# Patient Record
Sex: Female | Born: 1945 | Race: Black or African American | Hispanic: No | Smoking: Current some day smoker
Health system: Southern US, Community
[De-identification: ages and names within clinical notes are randomized; demographics above are authoritative.]

## PROBLEM LIST (undated history)

## (undated) DIAGNOSIS — Z8489 Family history of other specified conditions: Secondary | ICD-10-CM

## (undated) DIAGNOSIS — K219 Gastro-esophageal reflux disease without esophagitis: Secondary | ICD-10-CM

## (undated) DIAGNOSIS — Z973 Presence of spectacles and contact lenses: Secondary | ICD-10-CM

## (undated) DIAGNOSIS — E041 Nontoxic single thyroid nodule: Secondary | ICD-10-CM

## (undated) DIAGNOSIS — J40 Bronchitis, not specified as acute or chronic: Secondary | ICD-10-CM

## (undated) DIAGNOSIS — C801 Malignant (primary) neoplasm, unspecified: Secondary | ICD-10-CM

## (undated) DIAGNOSIS — M8580 Other specified disorders of bone density and structure, unspecified site: Secondary | ICD-10-CM

## (undated) DIAGNOSIS — M1990 Unspecified osteoarthritis, unspecified site: Secondary | ICD-10-CM

## (undated) DIAGNOSIS — K759 Inflammatory liver disease, unspecified: Secondary | ICD-10-CM

## (undated) DIAGNOSIS — I10 Essential (primary) hypertension: Secondary | ICD-10-CM

## (undated) HISTORY — PX: APPENDECTOMY: SHX54

## (undated) HISTORY — PX: KNEE ARTHROSCOPY: SHX127

## (undated) HISTORY — PX: TUBAL LIGATION: SHX77

## (undated) HISTORY — PX: BREAST SURGERY: SHX581

## (undated) HISTORY — PX: TONSILLECTOMY: SUR1361

---

## 1978-12-09 HISTORY — PX: ABDOMINAL HYSTERECTOMY: SHX81

## 1978-12-09 HISTORY — PX: THYROIDECTOMY: SHX17

## 1996-12-09 HISTORY — PX: OSTEOTOMY ULNA: SUR997

## 1996-12-09 HISTORY — PX: OSTEOTOMY AND ULNAR SHORTENING: SHX2140

## 1998-08-24 ENCOUNTER — Ambulatory Visit (HOSPITAL_COMMUNITY)
Admission: RE | Admit: 1998-08-24 | Discharge: 1998-08-24 | Payer: Self-pay | Admitting: Physical Medicine and Rehabilitation

## 1998-08-24 ENCOUNTER — Encounter: Payer: Self-pay | Admitting: Physical Medicine and Rehabilitation

## 1999-07-09 ENCOUNTER — Encounter: Payer: Self-pay | Admitting: Specialist

## 1999-07-09 ENCOUNTER — Ambulatory Visit (HOSPITAL_COMMUNITY): Admission: RE | Admit: 1999-07-09 | Discharge: 1999-07-09 | Payer: Self-pay | Admitting: Specialist

## 1999-10-08 ENCOUNTER — Ambulatory Visit (HOSPITAL_COMMUNITY): Admission: RE | Admit: 1999-10-08 | Discharge: 1999-10-08 | Payer: Self-pay | Admitting: Specialist

## 2012-10-21 ENCOUNTER — Encounter (INDEPENDENT_AMBULATORY_CARE_PROVIDER_SITE_OTHER): Payer: Self-pay

## 2012-10-23 ENCOUNTER — Encounter (INDEPENDENT_AMBULATORY_CARE_PROVIDER_SITE_OTHER): Payer: Self-pay | Admitting: Surgery

## 2012-10-23 ENCOUNTER — Ambulatory Visit (INDEPENDENT_AMBULATORY_CARE_PROVIDER_SITE_OTHER): Payer: Medicare Other | Admitting: Surgery

## 2012-10-23 VITALS — BP 136/70 | HR 79 | Temp 97.7°F | Resp 18 | Ht 60.0 in | Wt 148.0 lb

## 2012-10-23 DIAGNOSIS — C50919 Malignant neoplasm of unspecified site of unspecified female breast: Secondary | ICD-10-CM

## 2012-10-23 DIAGNOSIS — C50911 Malignant neoplasm of unspecified site of right female breast: Secondary | ICD-10-CM

## 2012-10-23 NOTE — Progress Notes (Signed)
Patient ID: Crystal Padilla, female   DOB: [birth year], 66 y.o.   MRN: [id number]  Chief Complaint  Patient presents with  . Breast Cancer    Right    HPI Crystal Padilla is a 66 y.o. female.  She recently had a mammogram an abnormality was found high in the upper outer quadrant of the right wrist. 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 Prognostic indicators are pending. The patient was asymptomatic and unaware of a palpable mass. Her mother had breast cancer it in her 68s and is still alive, was treated with lumpectomy. That is the only known family history of breast or ovarian cancer. HPI  No past medical history on file.  Past Surgical History  Procedure Date  . Abdominal hysterectomy 1980  . Thyroidectomy 1980  . Osteotomy ulna 1998    Family History  Problem Relation Age of Onset  . Cancer Mother     breast  . Heart disease Sister   . Hypertension Maternal Grandmother     Social History History  Substance Use Topics  . Smoking status: Current Some Day Smoker -- 1.0 packs/day  . Smokeless tobacco: Not on file     Comment: 40 yrs appx  . Alcohol Use: Not on file    Allergies  Allergen Reactions  . Penicillins Hives    Current Outpatient Prescriptions  Medication Sig Dispense Refill  . alendronate (FOSAMAX) 70 MG tablet Take 70 mg by mouth every 7 (seven) days. Take with a full glass of water on an empty stomach.      Marland Kitchen amLODipine (NORVASC) 5 MG tablet Take 5 mg by mouth daily.      . calcium citrate (CALCITRATE - DOSED IN MG ELEMENTAL CALCIUM) 950 MG tablet Take 1 tablet by mouth daily.      . fish oil-omega-3 fatty acids 1000 MG capsule Take 2 g by mouth daily.        Review of Systems Review of Systems  Constitutional: Negative for fever, chills and unexpected weight change.  HENT: Negative for hearing loss, congestion, sore throat, trouble swallowing and voice change.   Eyes: Negative for visual disturbance.  Respiratory: Negative  for cough and wheezing.   Cardiovascular: Negative for chest pain, palpitations and leg swelling.  Gastrointestinal: Negative for nausea, vomiting, abdominal pain, diarrhea, constipation, blood in stool, abdominal distention and anal bleeding.  Genitourinary: Negative for hematuria, vaginal bleeding and difficulty urinating.  Musculoskeletal: Negative for arthralgias.  Skin: Negative for rash and wound.  Neurological: Negative for seizures, syncope and headaches.  Hematological: Negative for adenopathy. Does not bruise/bleed easily.  Psychiatric/Behavioral: Negative for confusion.    Blood pressure 136/70, pulse 79, temperature 97.7 F (36.5 C), temperature source Temporal, resp. rate 18, height 5' (1.524 m), weight 148 lb (67.132 kg).  Physical Exam Physical Exam  Vitals reviewed. Constitutional: She is oriented to person, place, and time. She appears well-developed and well-nourished. No distress.  HENT:  Head: Normocephalic and atraumatic.  Mouth/Throat: Oropharynx is clear and moist.  Eyes: Conjunctivae normal and EOM are normal. Pupils are equal, round, and reactive to light. No scleral icterus.  Neck: Normal range of motion. Neck supple. No tracheal deviation present. No thyromegaly present.  Cardiovascular: Normal rate, regular rhythm, normal heart sounds and intact distal pulses.  Exam reveals no gallop and no friction rub.   No murmur heard. Pulmonary/Chest: Effort normal and breath sounds normal. No respiratory distress. She has no wheezes. She has no rales.  Hematoma and eccymosis from NCB  Abdominal: Soft. Bowel sounds are normal. She exhibits no distension and no mass. There is no tenderness. There is no rebound and no guarding.  Musculoskeletal: Normal range of motion. She exhibits no edema and no tenderness.  Lymphadenopathy:    She has no cervical adenopathy.    She has no axillary adenopathy.       Right: No supraclavicular adenopathy present.       Left: No  supraclavicular adenopathy present.  Neurological: She is alert and oriented to person, place, and time.  Skin: Skin is warm and dry. No rash noted. She is not diaphoretic. No erythema.  Psychiatric: She has a normal mood and affect. Her behavior is normal. Judgment and thought content normal.    Data Reviewed Mammogram/ultrasound show a hypoechoic irregular mass measuring 1.7 x 1.4 x 1.1 cm in the axillary tail. The axilla cells showed normal lymph nodes. The ncbbiopsy has shown invasive ductal carcinoma. Receptor panel is pending.  Assessment    Clinical stage I right breast cancer upper outer quadrant    Plan    I have explained the pathophysiology and staging of breast cancer with particular attention to her exact situation. We discussed the multidisciplinary approach to breast cancer which often includes both medical and radiation oncology consultations.  We also discussed surgical options for the treatment of breast cancer including lumpectomy and mastectomy with possible reconstructive surgery. In addition we talked about the evaluation and management of lymph nodes including a description of sentinel lymph node biopsy and axillary dissections. We reviewed potential complications and risks including bleeding, infection, numbness,  lymphedema, and the potential need for additional surgery.  She understands that for patients who are candidate for lumpectomy or mastectomy there is an equal survival rate with either technique, but a slightly higher local recurrence rate with lumpectomy. In addition she knows that a lumpectomy usually requires postoperative radiation as part of the management of the breast cancer.  We have discussed the likely postoperative course and plans for followup.  I have given the patient some written information that reviewed all of these issues. I believe her questions are answered and that she has a good understanding of the issues. We will obtain an MRI scan.  She should be presented next week at the breast clinic conference. As of now I think she is an excellent candidate for lumpectomy and sentinel node evaluation.       [name] J 10/23/2012, 9:05 AM

## 2012-10-23 NOTE — Addendum Note (Signed)
Addended byLiliana Cline on: 10/23/2012 09:30 AM   Modules accepted: Orders

## 2012-10-23 NOTE — Patient Instructions (Signed)
We will schedule an MRI examination of your breasts. We have that report back we can schedule your surgery.

## 2012-10-27 ENCOUNTER — Telehealth: Payer: Self-pay | Admitting: *Deleted

## 2012-10-27 ENCOUNTER — Encounter: Payer: Self-pay | Admitting: *Deleted

## 2012-10-27 NOTE — Telephone Encounter (Signed)
error 

## 2012-10-27 NOTE — Progress Notes (Signed)
Request sent to [facility] for path slides and imaging.

## 2012-11-03 ENCOUNTER — Other Ambulatory Visit: Payer: Self-pay

## 2012-11-06 ENCOUNTER — Ambulatory Visit
Admission: RE | Admit: 2012-11-06 | Discharge: 2012-11-06 | Disposition: A | Discharge status: Home / Self Care | Payer: Medicare Other | Source: Ambulatory Visit | Attending: Surgery | Admitting: Surgery

## 2012-11-06 DIAGNOSIS — C50911 Malignant neoplasm of unspecified site of right female breast: Secondary | ICD-10-CM

## 2012-11-06 MED ORDER — GADOBENATE DIMEGLUMINE 529 MG/ML IV SOLN
14.0000 mL | Freq: Once | INTRAVENOUS | Status: AC | PRN
  Administered 2012-11-06: 14 mL via INTRAVENOUS

## 2012-11-11 ENCOUNTER — Other Ambulatory Visit (INDEPENDENT_AMBULATORY_CARE_PROVIDER_SITE_OTHER): Payer: Self-pay | Admitting: Surgery

## 2012-11-11 ENCOUNTER — Telehealth (INDEPENDENT_AMBULATORY_CARE_PROVIDER_SITE_OTHER): Payer: Self-pay | Admitting: Surgery

## 2012-11-11 ENCOUNTER — Telehealth (INDEPENDENT_AMBULATORY_CARE_PROVIDER_SITE_OTHER): Payer: Self-pay | Admitting: General Surgery

## 2012-11-11 DIAGNOSIS — C50911 Malignant neoplasm of unspecified site of right female breast: Secondary | ICD-10-CM

## 2012-11-11 NOTE — Telephone Encounter (Signed)
Pt called to inquire about her imaging results from 11/06/12.  Please call her when they have been reviewed.

## 2012-11-11 NOTE — Telephone Encounter (Signed)
Left message on machine for patient to call back and ask for me. Dr Jamey Ripa has reviewed MR results and it shows only the area we know about. We can proceed with lumpetomy as discussed in the office. Orders sent to scheduling.

## 2012-11-11 NOTE — Telephone Encounter (Signed)
Spoke with patient. She would like to proceed with lumpectomy and sentinel node. She is aware scheduling has her orders.

## 2012-11-12 NOTE — Telephone Encounter (Signed)
Tried to call her, left message. She then spoke with nursing and is aware of MRI report and wishes to proceed to lumpectomy

## 2012-11-19 ENCOUNTER — Encounter (HOSPITAL_BASED_OUTPATIENT_CLINIC_OR_DEPARTMENT_OTHER): Payer: Self-pay | Admitting: *Deleted

## 2012-11-19 NOTE — Progress Notes (Signed)
To come in for labs, ua,cxr,ekg No cardiac or resp problems-she does smoke

## 2012-11-23 ENCOUNTER — Ambulatory Visit: Payer: Medicare Other

## 2012-11-23 ENCOUNTER — Encounter (HOSPITAL_BASED_OUTPATIENT_CLINIC_OR_DEPARTMENT_OTHER)
Admission: RE | Admit: 2012-11-23 | Discharge: 2012-11-23 | Disposition: A | Discharge status: Home / Self Care | Payer: Medicare Other | Source: Ambulatory Visit | Attending: Surgery | Admitting: Surgery

## 2012-11-23 ENCOUNTER — Telehealth (INDEPENDENT_AMBULATORY_CARE_PROVIDER_SITE_OTHER): Payer: Self-pay | Admitting: General Surgery

## 2012-11-23 ENCOUNTER — Other Ambulatory Visit (INDEPENDENT_AMBULATORY_CARE_PROVIDER_SITE_OTHER): Payer: Self-pay | Admitting: Surgery

## 2012-11-23 ENCOUNTER — Other Ambulatory Visit: Payer: Self-pay

## 2012-11-23 ENCOUNTER — Ambulatory Visit
Admission: RE | Admit: 2012-11-23 | Discharge: 2012-11-23 | Disposition: A | Discharge status: Home / Self Care | Payer: Medicare Other | Source: Ambulatory Visit | Attending: Surgery | Admitting: Surgery

## 2012-11-23 DIAGNOSIS — Z01811 Encounter for preprocedural respiratory examination: Secondary | ICD-10-CM

## 2012-11-23 LAB — CBC WITH DIFFERENTIAL/PLATELET
Eosinophils Relative: 1 % (ref 0–5)
HCT: 45 % (ref 36.0–46.0)
Hemoglobin: 16.5 g/dL — ABNORMAL HIGH (ref 12.0–15.0)
Lymphocytes Relative: 44 % (ref 12–46)
Lymphs Abs: 3.6 10*3/uL (ref 0.7–4.0)
MCV: 85.6 fL (ref 78.0–100.0)
Monocytes Absolute: 0.5 10*3/uL (ref 0.1–1.0)
Monocytes Relative: 6 % (ref 3–12)
Neutro Abs: 4 10*3/uL (ref 1.7–7.7)
WBC: 8.2 10*3/uL (ref 4.0–10.5)

## 2012-11-23 LAB — COMPREHENSIVE METABOLIC PANEL
AST: 20 U/L (ref 0–37)
BUN: 6 mg/dL (ref 6–23)
CO2: 24 mEq/L (ref 19–32)
Calcium: 9.7 mg/dL (ref 8.4–10.5)
Chloride: 95 mEq/L — ABNORMAL LOW (ref 96–112)
Creatinine, Ser: 0.51 mg/dL (ref 0.50–1.10)
GFR calc Af Amer: >90 mL/min (ref >90)
GFR calc non Af Amer: >90 mL/min (ref >90)
Glucose, Bld: 197 mg/dL — ABNORMAL HIGH (ref 70–99)
Total Bilirubin: 0.5 mg/dL (ref 0.3–1.2)

## 2012-11-23 LAB — URINALYSIS, ROUTINE W REFLEX MICROSCOPIC
Hgb urine dipstick: NEGATIVE
Nitrite: NEGATIVE
Protein, ur: NEGATIVE mg/dL
Urobilinogen, UA: 0.2 mg/dL (ref 0.0–1.0)

## 2012-11-23 NOTE — Telephone Encounter (Signed)
Crystal Padilla called to report abnormal pre-op labs. Surgery is tomorrow 11-24-12, breast bx.  Blood glucose is 197, Urine glucose is 100. She said pt does not have history of diabetes, and believes that labs were non-fasting. Ph- [phone]/gy

## 2012-11-23 NOTE — Telephone Encounter (Signed)
Will plan to proceed with surgery and re-evaluate her glucose post op

## 2012-11-23 NOTE — Progress Notes (Signed)
Spoke to [facility] triage at [facility] regarding pts labs:  glucose of 197 and glucose in the urine 100mg /dl.

## 2012-11-24 ENCOUNTER — Encounter (HOSPITAL_COMMUNITY)
Admission: RE | Admit: 2012-11-24 | Discharge: 2012-11-24 | Disposition: A | Discharge status: Home / Self Care | Payer: Medicare Other | Source: Ambulatory Visit | Attending: Surgery | Admitting: Surgery

## 2012-11-24 ENCOUNTER — Ambulatory Visit
Admission: RE | Admit: 2012-11-24 | Discharge: 2012-11-24 | Disposition: A | Discharge status: Home / Self Care | Payer: Medicare Other | Source: Ambulatory Visit | Attending: Surgery | Admitting: Surgery

## 2012-11-24 ENCOUNTER — Encounter (HOSPITAL_BASED_OUTPATIENT_CLINIC_OR_DEPARTMENT_OTHER): Payer: Self-pay | Admitting: *Deleted

## 2012-11-24 ENCOUNTER — Encounter (HOSPITAL_BASED_OUTPATIENT_CLINIC_OR_DEPARTMENT_OTHER)
Admission: RE | Disposition: A | Discharge status: Home / Self Care | Payer: Self-pay | Source: Ambulatory Visit | Attending: Surgery

## 2012-11-24 ENCOUNTER — Ambulatory Visit (HOSPITAL_BASED_OUTPATIENT_CLINIC_OR_DEPARTMENT_OTHER)
Admission: RE | Admit: 2012-11-24 | Discharge: 2012-11-24 | Disposition: A | Discharge status: Home / Self Care | Payer: Medicare Other | Source: Ambulatory Visit | Attending: Surgery | Admitting: Surgery

## 2012-11-24 ENCOUNTER — Ambulatory Visit (HOSPITAL_BASED_OUTPATIENT_CLINIC_OR_DEPARTMENT_OTHER): Payer: Medicare Other | Admitting: *Deleted

## 2012-11-24 ENCOUNTER — Other Ambulatory Visit (INDEPENDENT_AMBULATORY_CARE_PROVIDER_SITE_OTHER): Payer: Self-pay | Admitting: Surgery

## 2012-11-24 DIAGNOSIS — C50419 Malignant neoplasm of upper-outer quadrant of unspecified female breast: Secondary | ICD-10-CM | POA: Insufficient documentation

## 2012-11-24 DIAGNOSIS — Z803 Family history of malignant neoplasm of breast: Secondary | ICD-10-CM | POA: Insufficient documentation

## 2012-11-24 DIAGNOSIS — Z880 Allergy status to penicillin: Secondary | ICD-10-CM | POA: Insufficient documentation

## 2012-11-24 DIAGNOSIS — Z90710 Acquired absence of both cervix and uterus: Secondary | ICD-10-CM | POA: Insufficient documentation

## 2012-11-24 DIAGNOSIS — C50911 Malignant neoplasm of unspecified site of right female breast: Secondary | ICD-10-CM

## 2012-11-24 DIAGNOSIS — Z01810 Encounter for preprocedural cardiovascular examination: Secondary | ICD-10-CM | POA: Insufficient documentation

## 2012-11-24 DIAGNOSIS — F17200 Nicotine dependence, unspecified, uncomplicated: Secondary | ICD-10-CM | POA: Insufficient documentation

## 2012-11-24 DIAGNOSIS — K219 Gastro-esophageal reflux disease without esophagitis: Secondary | ICD-10-CM | POA: Insufficient documentation

## 2012-11-24 DIAGNOSIS — I10 Essential (primary) hypertension: Secondary | ICD-10-CM | POA: Insufficient documentation

## 2012-11-24 DIAGNOSIS — C50919 Malignant neoplasm of unspecified site of unspecified female breast: Secondary | ICD-10-CM

## 2012-11-24 DIAGNOSIS — Z01812 Encounter for preprocedural laboratory examination: Secondary | ICD-10-CM | POA: Insufficient documentation

## 2012-11-24 HISTORY — DX: Gastro-esophageal reflux disease without esophagitis: K21.9

## 2012-11-24 HISTORY — DX: Family history of other specified conditions: Z84.89

## 2012-11-24 HISTORY — PX: BREAST LUMPECTOMY WITH NEEDLE LOCALIZATION AND AXILLARY SENTINEL LYMPH NODE BX: SHX5760

## 2012-11-24 HISTORY — DX: Essential (primary) hypertension: I10

## 2012-11-24 HISTORY — DX: Other specified disorders of bone density and structure, unspecified site: M85.80

## 2012-11-24 HISTORY — DX: Unspecified osteoarthritis, unspecified site: M19.90

## 2012-11-24 HISTORY — DX: Malignant (primary) neoplasm, unspecified: C80.1

## 2012-11-24 HISTORY — DX: Presence of spectacles and contact lenses: Z97.3

## 2012-11-24 SURGERY — BREAST LUMPECTOMY WITH NEEDLE LOCALIZATION AND AXILLARY SENTINEL LYMPH NODE BX
Anesthesia: General | Site: Breast | Laterality: Right | Wound class: Clean

## 2012-11-24 MED ORDER — CIPROFLOXACIN IN D5W 400 MG/200ML IV SOLN
400.0000 mg | INTRAVENOUS | Status: AC
  Administered 2012-11-24: 400 mg via INTRAVENOUS

## 2012-11-24 MED ORDER — SODIUM CHLORIDE 0.9 % IJ SOLN
INTRAMUSCULAR | Status: DC | PRN
  Administered 2012-11-24: 12:00:00

## 2012-11-24 MED ORDER — DEXAMETHASONE SODIUM PHOSPHATE 4 MG/ML IJ SOLN
INTRAMUSCULAR | Status: DC | PRN
  Administered 2012-11-24: 10 mg via INTRAVENOUS

## 2012-11-24 MED ORDER — TECHNETIUM TC 99M SULFUR COLLOID FILTERED
1.0000 | Freq: Once | INTRAVENOUS | Status: AC | PRN
  Administered 2012-11-24: 1 via INTRADERMAL

## 2012-11-24 MED ORDER — PROPOFOL 10 MG/ML IV BOLUS
INTRAVENOUS | Status: DC | PRN
  Administered 2012-11-24: 150 mg via INTRAVENOUS

## 2012-11-24 MED ORDER — CHLORHEXIDINE GLUCONATE 4 % EX LIQD: 1.0000 "application " | Freq: Once | CUTANEOUS | Status: DC

## 2012-11-24 MED ORDER — FENTANYL CITRATE 0.05 MG/ML IJ SOLN
50.0000 ug | Freq: Once | INTRAMUSCULAR | Status: AC
  Administered 2012-11-24: 100 ug via INTRAVENOUS

## 2012-11-24 MED ORDER — ONDANSETRON HCL 4 MG/2ML IJ SOLN
INTRAMUSCULAR | Status: DC | PRN
  Administered 2012-11-24: 4 mg via INTRAVENOUS

## 2012-11-24 MED ORDER — BUPIVACAINE HCL (PF) 0.25 % IJ SOLN
INTRAMUSCULAR | Status: DC | PRN
  Administered 2012-11-24: 30 mL

## 2012-11-24 MED ORDER — FENTANYL CITRATE 0.05 MG/ML IJ SOLN
INTRAMUSCULAR | Status: DC | PRN
  Administered 2012-11-24: 50 ug via INTRAVENOUS
  Administered 2012-11-24 (×2): 25 ug via INTRAVENOUS

## 2012-11-24 MED ORDER — LIDOCAINE HCL (CARDIAC) 20 MG/ML IV SOLN
INTRAVENOUS | Status: DC | PRN
  Administered 2012-11-24: 80 mg via INTRAVENOUS

## 2012-11-24 MED ORDER — LACTATED RINGERS IV SOLN
INTRAVENOUS | Status: DC
  Administered 2012-11-24 (×2): via INTRAVENOUS

## 2012-11-24 MED ORDER — HYDROMORPHONE HCL PF 1 MG/ML IJ SOLN
0.2500 mg | INTRAMUSCULAR | Status: DC | PRN
  Administered 2012-11-24 (×3): 0.5 mg via INTRAVENOUS

## 2012-11-24 MED ORDER — MIDAZOLAM HCL 2 MG/2ML IJ SOLN
1.0000 mg | INTRAMUSCULAR | Status: DC | PRN
  Administered 2012-11-24: 2 mg via INTRAVENOUS

## 2012-11-24 MED ORDER — HYDROCODONE-ACETAMINOPHEN 5-325 MG PO TABS: 1.0000 | ORAL_TABLET | ORAL | Status: DC | PRN

## 2012-11-24 SURGICAL SUPPLY — 67 items
ADH SKN CLS APL DERMABOND .7 (GAUZE/BANDAGES/DRESSINGS) ×2
APPLIER CLIP 11 MED OPEN (CLIP)
APPLIER CLIP 9.375 MED OPEN (MISCELLANEOUS)
APR CLP MED 11 20 MLT OPN (CLIP)
APR CLP MED 9.3 20 MLT OPN (MISCELLANEOUS)
BLADE HEX COATED 2.75 (ELECTRODE) ×2 IMPLANT
BLADE SURG 15 STRL LF DISP TIS (BLADE) ×2 IMPLANT
BLADE SURG 15 STRL SS (BLADE) ×4
CANISTER SUCTION 1200CC (MISCELLANEOUS) ×2 IMPLANT
CHLORAPREP W/TINT 26ML (MISCELLANEOUS) ×2 IMPLANT
CLIP APPLIE 11 MED OPEN (CLIP) IMPLANT
CLIP APPLIE 9.375 MED OPEN (MISCELLANEOUS) IMPLANT
CLIP TI MEDIUM 6 (CLIP) IMPLANT
CLIP TI WIDE RED SMALL 6 (CLIP) ×2 IMPLANT
CLOTH BEACON ORANGE TIMEOUT ST (SAFETY) ×2 IMPLANT
COVER MAYO STAND STRL (DRAPES) ×2 IMPLANT
COVER PROBE 5X48 (MISCELLANEOUS)
COVER PROBE W GEL 5X96 (DRAPES) ×2 IMPLANT
COVER TABLE BACK 60X90 (DRAPES) ×2 IMPLANT
DECANTER SPIKE VIAL GLASS SM (MISCELLANEOUS) IMPLANT
DERMABOND ADVANCED (GAUZE/BANDAGES/DRESSINGS) ×2
DERMABOND ADVANCED .7 DNX12 (GAUZE/BANDAGES/DRESSINGS) ×2 IMPLANT
DEVICE DUBIN W/COMP PLATE 8390 (MISCELLANEOUS) ×1 IMPLANT
DRAIN CHANNEL 19F RND (DRAIN) IMPLANT
DRAPE LAPAROSCOPIC ABDOMINAL (DRAPES) ×2 IMPLANT
DRAPE SURG 17X23 STRL (DRAPES) ×2 IMPLANT
DRAPE UTILITY XL STRL (DRAPES) ×2 IMPLANT
DRSG EMULSION OIL 3X3 NADH (GAUZE/BANDAGES/DRESSINGS) ×4 IMPLANT
ELECT BLADE 4.0 EZ CLEAN MEGAD (MISCELLANEOUS)
ELECT REM PT RETURN 9FT ADLT (ELECTROSURGICAL) ×2
ELECTRODE BLDE 4.0 EZ CLN MEGD (MISCELLANEOUS) IMPLANT
ELECTRODE REM PT RTRN 9FT ADLT (ELECTROSURGICAL) ×1 IMPLANT
EVACUATOR SILICONE 100CC (DRAIN) IMPLANT
GLOVE BIOGEL PI IND STRL 7.5 (GLOVE) IMPLANT
GLOVE BIOGEL PI INDICATOR 7.5 (GLOVE) ×1
GLOVE ECLIPSE 6.5 STRL STRAW (GLOVE) ×1 IMPLANT
GLOVE ECLIPSE 7.0 STRL STRAW (GLOVE) ×1 IMPLANT
GLOVE EUDERMIC 7 POWDERFREE (GLOVE) ×2 IMPLANT
GOWN BRE IMP PREV XXLGXLNG (GOWN DISPOSABLE) ×2 IMPLANT
GOWN PREVENTION PLUS XLARGE (GOWN DISPOSABLE) ×4 IMPLANT
KIT CVR 48X5XPRB PLUP LF (MISCELLANEOUS) IMPLANT
KIT MARKER MARGIN INK (KITS) ×2 IMPLANT
NDL HYPO 25X1 1.5 SAFETY (NEEDLE) ×2 IMPLANT
NDL SAFETY ECLIPSE 18X1.5 (NEEDLE) ×1 IMPLANT
NEEDLE HYPO 18GX1.5 SHARP (NEEDLE) ×2
NEEDLE HYPO 25X1 1.5 SAFETY (NEEDLE) ×4 IMPLANT
NS IRRIG 1000ML POUR BTL (IV SOLUTION) ×2 IMPLANT
PACK BASIN DAY SURGERY FS (CUSTOM PROCEDURE TRAY) ×2 IMPLANT
PENCIL BUTTON HOLSTER BLD 10FT (ELECTRODE) ×2 IMPLANT
PIN SAFETY STERILE (MISCELLANEOUS) IMPLANT
SHEET MEDIUM DRAPE 40X70 STRL (DRAPES) ×3 IMPLANT
SLEEVE SCD COMPRESS KNEE MED (MISCELLANEOUS) ×2 IMPLANT
SPONGE GAUZE 4X4 12PLY (GAUZE/BANDAGES/DRESSINGS) IMPLANT
SPONGE INTESTINAL PEANUT (DISPOSABLE) IMPLANT
SPONGE LAP 18X18 X RAY DECT (DISPOSABLE) IMPLANT
SPONGE LAP 4X18 X RAY DECT (DISPOSABLE) ×2 IMPLANT
SUT ETHILON 2 0 FS 18 (SUTURE) IMPLANT
SUT ETHILON 3 0 FSL (SUTURE) IMPLANT
SUT MNCRL AB 4-0 PS2 18 (SUTURE) ×4 IMPLANT
SUT VIC AB 4-0 BRD 54 (SUTURE) IMPLANT
SUT VICRYL 3-0 CR8 SH (SUTURE) ×4 IMPLANT
SYR CONTROL 10ML LL (SYRINGE) ×4 IMPLANT
TOWEL OR 17X24 6PK STRL BLUE (TOWEL DISPOSABLE) ×2 IMPLANT
TOWEL OR NON WOVEN STRL DISP B (DISPOSABLE) ×2 IMPLANT
TUBE CONNECTING 20X1/4 (TUBING) ×2 IMPLANT
WATER STERILE IRR 1000ML POUR (IV SOLUTION) IMPLANT
YANKAUER SUCT BULB TIP NO VENT (SUCTIONS) ×2 IMPLANT

## 2012-11-24 NOTE — Anesthesia Procedure Notes (Signed)
Procedure Name: LMA Insertion Date/Time: 11/24/2012 11:52 AM Performed by: Meyer Russel Pre-anesthesia Checklist: Patient identified, Emergency Drugs available, Suction available and Patient being monitored Patient Re-evaluated:Patient Re-evaluated prior to inductionOxygen Delivery Method: Circle System Utilized Preoxygenation: Pre-oxygenation with 100% oxygen Intubation Type: IV induction Ventilation: Mask ventilation without difficulty LMA: LMA inserted LMA Size: 4.0 Number of attempts: 1 Airway Equipment and Method: bite block Placement Confirmation: positive ETCO2 and breath sounds checked- equal and bilateral Tube secured with: Tape Dental Injury: Teeth and Oropharynx as per pre-operative assessment

## 2012-11-24 NOTE — H&P (Signed)
Patient ID: Crystal Padilla, female DOB: Dec 02, 1946, 66 y.o. MRN: [id number]  Chief Complaint   Patient presents with   .  Breast Cancer     Right   HPI  Crystal Padilla is a 66 y.o. female. She recently had a mammogram an abnormality was found high in the upper outer quadrant of the right breast. A needle core biopsy has been done showing invasive ductal carcinoma, The patient was asymptomatic and unaware of a palpable mass. Her mother had breast cancer it in her 54s and is still alive, was treated with lumpectomy. That is the only known family history of breast or ovarian cancer. She presents today for lumpectomy and sentinel node excision HPI  No past medical history on file.  Past Surgical History   Procedure  Date   .  Abdominal hysterectomy  1980   .  Thyroidectomy  1980   .  Osteotomy ulna  1998    Family History   Problem  Relation  Age of Onset   .  Cancer  Mother       breast    .  Heart disease  Sister    .  Hypertension  Maternal Grandmother    Social History  History   Substance Use Topics   .  Smoking status:  Current Some Day Smoker -- 1.0 packs/day   .  Smokeless tobacco:  Not on file      Comment: 40 yrs appx   .  Alcohol Use:  Not on file    Allergies   Allergen  Reactions   .  Penicillins  Hives    Current Outpatient Prescriptions   Medication  Sig  Dispense  Refill   .  alendronate (FOSAMAX) 70 MG tablet  Take 70 mg by mouth every 7 (seven) days. Take with a full glass of water on an empty stomach.     Marland Kitchen  amLODipine (NORVASC) 5 MG tablet  Take 5 mg by mouth daily.     .  calcium citrate (CALCITRATE - DOSED IN MG ELEMENTAL CALCIUM) 950 MG tablet  Take 1 tablet by mouth daily.     .  fish oil-omega-3 fatty acids 1000 MG capsule  Take 2 g by mouth daily.     Review of Systems  Review of Systems  Constitutional: Negative for fever, chills and unexpected weight change.  HENT: Negative for hearing loss, congestion, sore throat, trouble swallowing and voice  change.  Eyes: Negative for visual disturbance.  Respiratory: Negative for cough and wheezing.  Cardiovascular: Negative for chest pain, palpitations and leg swelling.  Gastrointestinal: Negative for nausea, vomiting, abdominal pain, diarrhea, constipation, blood in stool, abdominal distention and anal bleeding.  Genitourinary: Negative for hematuria, vaginal bleeding and difficulty urinating.  Musculoskeletal: Negative for arthralgias.  Skin: Negative for rash and wound.  Neurological: Negative for seizures, syncope and headaches.  Hematological: Negative for adenopathy. Does not bruise/bleed easily.  Psychiatric/Behavioral: Negative for confusion.  Blood pressure 136/70, pulse 79, temperature 97.7 F (36.5 C), temperature source Temporal, resp. rate 18, height 5' (1.524 m), weight 148 lb (67.132 kg).  Physical Exam  Physical Exam  Vitals reviewed.  Constitutional: She is oriented to person, place, and time. She appears well-developed and well-nourished. No distress.  HENT:  Head: Normocephalic and atraumatic.  Mouth/Throat: Oropharynx is clear and moist.  Eyes: Conjunctivae normal and EOM are normal. Pupils are equal, round, and reactive to light. No scleral icterus.  Neck: Normal range of motion. Neck supple. No tracheal  deviation present. No thyromegaly present.  Cardiovascular: Normal rate, regular rhythm, normal heart sounds and intact distal pulses. Exam reveals no gallop and no friction rub.  No murmur heard.  Pulmonary/Chest: Effort normal and breath sounds normal. No respiratory distress. She has no wheezes. She has no rales.    Hematoma and eccymosis from NCB  Abdominal: Soft. Bowel sounds are normal. She exhibits no distension and no mass. There is no tenderness. There is no rebound and no guarding.  Musculoskeletal: Normal range of motion. She exhibits no edema and no tenderness.  Lymphadenopathy:  She has no cervical adenopathy.  She has no axillary adenopathy.  Right:  No supraclavicular adenopathy present.  Left: No supraclavicular adenopathy present.  Neurological: She is alert and oriented to person, place, and time.  Skin: Skin is warm and dry. No rash noted. She is not diaphoretic. No erythema.  Psychiatric: She has a normal mood and affect. Her behavior is normal. Judgment and thought content normal.  Data Reviewed  Mammogram/ultrasound show a hypoechoic irregular mass measuring 1.7 x 1.4 x 1.1 cm in the axillary tail. The axilla cells showed normal lymph nodes. The ncbbiopsy has shown invasive ductal carcinoma. Receptor panel is pending.  Assessment  Clinical stage I right breast cancer upper outer quadrant  Plan  We will proceed with NL lumpectomy and sentinel node evaluation. She has no other questions and I have reviewed the wire loc films and marked the right breast as the operative side

## 2012-11-24 NOTE — Anesthesia Postprocedure Evaluation (Signed)
  Anesthesia Post-op Note  Patient: Crystal Padilla  Procedure(s) Performed: Procedure(s) (LRB) with comments: BREAST LUMPECTOMY WITH NEEDLE LOCALIZATION AND AXILLARY SENTINEL LYMPH NODE BX (Right)  Patient Location: PACU  Anesthesia Type:General  Level of Consciousness: awake  Airway and Oxygen Therapy: Patient Spontanous Breathing  Post-op Pain: mild  Post-op Assessment: Post-op Vital signs reviewed  Post-op Vital Signs: Reviewed  Complications: No apparent anesthesia complications

## 2012-11-24 NOTE — Anesthesia Preprocedure Evaluation (Addendum)
Anesthesia Evaluation  Patient identified by MRN, date of birth, ID band Patient awake    Reviewed: Allergy & Precautions, H&P , NPO status , Patient's Chart, lab work & pertinent test results  Airway Mallampati: II      Dental   Pulmonary Current Smoker,  breath sounds clear to auscultation        Cardiovascular hypertension, Pt. on medications Rhythm:Regular Rate:Normal     Neuro/Psych    GI/Hepatic Neg liver ROS, GERD-  ,  Endo/Other    Renal/GU negative Renal ROS     Musculoskeletal   Abdominal   Peds  Hematology   Anesthesia Other Findings   Reproductive/Obstetrics                           Anesthesia Physical Anesthesia Plan  ASA: III  Anesthesia Plan: General   Post-op Pain Management:    Induction: Intravenous  Airway Management Planned: LMA  Additional Equipment:   Intra-op Plan:   Post-operative Plan:   Informed Consent: I have reviewed the patients History and Physical, chart, labs and discussed the procedure including the risks, benefits and alternatives for the proposed anesthesia with the patient or authorized representative who has indicated his/her understanding and acceptance.   Dental advisory given  Plan Discussed with: CRNA, Anesthesiologist and Surgeon  Anesthesia Plan Comments:         Anesthesia Quick Evaluation

## 2012-11-24 NOTE — Op Note (Addendum)
Crystal Padilla August 28, 1946 [id number] 11/11/2012  Preoperative diagnosis: invasive ductal carcinoma, right breast, upper outer quadrant, clinical stage I  Postoperative diagnosis: this 72  Procedure: wire localized RIGHT partial mastectomy with a blue dye injection and axillary sentinel lymph node excision  Surgeon: Currie Paris, MD, FACS   Anesthesia: General   Clinical History and Indications: this patient was recently found to have a carcinoma in the upper-outer quadrant of the right breast. After discussion with patient in consultation with medical and radiation oncology it was elected to proceed with partial mastectomy and sentinel node evaluation.    Description of Procedure: I saw the patient in a properly. From the plans. I marked the right breast and reviewed the mammogram film the patient was taken to the operative room and after satisfactory general anesthesia was obtained a timeout was performed. 5 cc of dilute methylene blue was injected around the subareolar area and massaged in. A full prep and drape was then done.  The mass is actually palpable and there was a mark on the skin placed by the radiologist. The guidewire entered inferior to this and tracked up to it. I made a curvilinear incision directly over the mass. I elevated very thin skin flaps and excised the mass in toto and thought I had a margin in all directions grossly. I went down to and included the fascia with the deep margin. Bleeders were either electrocoagulated or tied. Once she has achieved hemostasis I used the neoprobe to identify a hot area. Once this was close to the axilla and I was able to find 2 sentinel lymph nodes through this same incision by opening the clavipectoral fascia. Both lymph nodes were hot and blue was counseled about 150 and 350. Once these 2 were removed I saw no other enlarged lymph nodes, and counts from back to 0-5. No other blue lymph nodes were identified.  The area was  irrigated and checked for hemostasis. 30 cc of 0.25% plain Marcaine was injected to help with postop pain relief. Clips were placed to mark the margins. The specimen had been marked for orientation purposes The incision was closed in layers with 3-0 Vicryl followed by 4-0 Monocryl subcuticular plus Dermabond.  The patient tolerated the procedure well. There no operative complications. All counts were correct  Estimated blood loss was minimal.  .Currie Paris, MD, FACS 11/24/2012 1:03 PM

## 2012-11-24 NOTE — Progress Notes (Signed)
Emotional support during breast injections °

## 2012-11-24 NOTE — Transfer of Care (Signed)
Immediate Anesthesia Transfer of Care Note  Patient: Crystal Padilla  Procedure(s) Performed: Procedure(s) (LRB) with comments: BREAST LUMPECTOMY WITH NEEDLE LOCALIZATION AND AXILLARY SENTINEL LYMPH NODE BX (Right)  Patient Location: PACU  Anesthesia Type:General  Level of Consciousness: awake, alert  and oriented  Airway & Oxygen Therapy: Patient Spontanous Breathing and Patient connected to face mask oxygen  Post-op Assessment: Report given to PACU RN, Post -op Vital signs reviewed and stable and Patient moving all extremities  Post vital signs: Reviewed and stable  Complications: No apparent anesthesia complications

## 2012-11-25 ENCOUNTER — Encounter (HOSPITAL_BASED_OUTPATIENT_CLINIC_OR_DEPARTMENT_OTHER): Payer: Self-pay | Admitting: Surgery

## 2012-11-27 ENCOUNTER — Telehealth (INDEPENDENT_AMBULATORY_CARE_PROVIDER_SITE_OTHER): Payer: Self-pay | Admitting: General Surgery

## 2012-11-27 NOTE — Telephone Encounter (Signed)
Patient aware path results are good. Lymph nodes negative and margins ok. She will follow up in the office at her scheduled appt and call with any questions prior.  

## 2012-11-27 NOTE — Telephone Encounter (Signed)
Message copied by Liliana Cline on Fri Nov 27, 2012  9:55 AM ------      Message from: Currie Paris      Created: Fri Nov 27, 2012  9:08 AM       Tell the patient that her margins are OK and her lymph nodes are negative. I will discuss in detail in the office.

## 2012-12-03 ENCOUNTER — Telehealth (INDEPENDENT_AMBULATORY_CARE_PROVIDER_SITE_OTHER): Payer: Self-pay | Admitting: General Surgery

## 2012-12-03 NOTE — Telephone Encounter (Signed)
Pt called to report she has broken out in all the areas where she was cleansed for the surgical procedure.  It is red and slightly itchy.  She has been putting cornstarch on it and called to ask about trying cortisone cream instead.  Cautioned her to try one small area of the redness with a thin application of the hydrocortison.  If that area responds positively, then apply to other areas with a thin coat.  Do not use for more that 7-10 days.  Report results to Dr. Jamey Ripa at appt on Friday.

## 2012-12-11 ENCOUNTER — Encounter (INDEPENDENT_AMBULATORY_CARE_PROVIDER_SITE_OTHER): Payer: Self-pay | Admitting: Surgery

## 2012-12-11 ENCOUNTER — Ambulatory Visit (INDEPENDENT_AMBULATORY_CARE_PROVIDER_SITE_OTHER): Payer: Medicare Other | Admitting: Surgery

## 2012-12-11 VITALS — BP 124/72 | HR 74 | Temp 97.9°F | Resp 18 | Ht 59.5 in | Wt 146.4 lb

## 2012-12-11 DIAGNOSIS — C50919 Malignant neoplasm of unspecified site of unspecified female breast: Secondary | ICD-10-CM

## 2012-12-11 DIAGNOSIS — C50911 Malignant neoplasm of unspecified site of right female breast: Secondary | ICD-10-CM

## 2012-12-11 NOTE — Addendum Note (Signed)
Addended byLiliana Cline on: 12/11/2012 10:59 AM   Modules accepted: Orders

## 2012-12-11 NOTE — Patient Instructions (Signed)
We will arrange medical and radiation oncology appointments for you. I need to see you back here to check your incisions in about a month or 6 weeks

## 2012-12-11 NOTE — Progress Notes (Signed)
Crystal Padilla    [id number] 12/11/2012    Feb 05, 1946   CC: Post op lumpectomy and sentinel left node, right breast  HPI: The patient returns for post op follow-up. She underwent a a right partial mastectomy with lymph node evaluation on 11/24/12. Over all she feels that she is doing well. She developed some form of rash around the incision but that is now completely resolved.  PE: VITAL SIGNS: BP 124/72  Pulse 74  Temp 97.9 F (36.6 C) (Temporal)  Resp 18  Ht 4' 11.5" (1.511 m)  Wt 146 lb 6 oz (66.395 kg)  BMI 29.07 kg/m2  The incision is healing nicely and there is no evidence of infection or hematoma.    DATA REVIEWED: Pathology report showed Diagnosis 1. Breast, lumpectomy, Right - INVASIVE GRADE III DUCTAL CARCINOMA, SPANNING 2.8 CM IN GREATEST DIMENSION. - DEFINITIVE LYMPH/VASCULAR INVASION IS NOT IDENTIFIED. - MARGINS ARE NEGATIVE. - SEE ONCOLOGY TEMPLATE. 2. Lymph node, sentinel, biopsy, Right #1 - ONE BENIGN LYMPH NODE WITH NO TUMOR SEEN (0/1). 3. Lymph node, sentinel, biopsy, Right #2 - ONE BENIGN LYMPH NODE WITH NO TUMOR SEEN (0/1).  IMPRESSION: Patient doing well. No apparent problems.  PLAN: Her next visit will be in 4-6 weeks. I gave her a copy of the pathology report and reviewed with her. Her cancer was bigger than anticipated by preoperative studies, so she is stage II. We will arrange medical radiation consultations for her in [place]

## 2012-12-18 ENCOUNTER — Telehealth (INDEPENDENT_AMBULATORY_CARE_PROVIDER_SITE_OTHER): Payer: Self-pay | Admitting: General Surgery

## 2012-12-18 NOTE — Telephone Encounter (Signed)
Pt was called by med-onc for appt in [place] (not in Epic system.)  She was expecting this.  Shortly afterward, she was notified of appt with rad-onc.  This was not expected. After discussion with her, she will keep appt with med-on and learn the plan of care for her cancer treatment, and if is included radiation as well, they can schedule it then.  She considers this a workable solution and will comply.

## 2012-12-21 ENCOUNTER — Encounter: Payer: Medicare Other | Admitting: Internal Medicine

## 2012-12-21 DIAGNOSIS — N61 Mastitis without abscess: Secondary | ICD-10-CM

## 2012-12-21 DIAGNOSIS — C50619 Malignant neoplasm of axillary tail of unspecified female breast: Secondary | ICD-10-CM

## 2012-12-31 DIAGNOSIS — N61 Mastitis without abscess: Secondary | ICD-10-CM

## 2012-12-31 DIAGNOSIS — C50619 Malignant neoplasm of axillary tail of unspecified female breast: Secondary | ICD-10-CM

## 2013-01-14 DIAGNOSIS — C50919 Malignant neoplasm of unspecified site of unspecified female breast: Secondary | ICD-10-CM

## 2013-01-14 DIAGNOSIS — N61 Mastitis without abscess: Secondary | ICD-10-CM

## 2013-01-14 DIAGNOSIS — Z171 Estrogen receptor negative status [ER-]: Secondary | ICD-10-CM

## 2013-01-22 ENCOUNTER — Encounter (INDEPENDENT_AMBULATORY_CARE_PROVIDER_SITE_OTHER): Payer: Medicare Other | Admitting: Surgery

## 2013-01-27 ENCOUNTER — Ambulatory Visit (INDEPENDENT_AMBULATORY_CARE_PROVIDER_SITE_OTHER): Payer: Medicare Other | Admitting: Surgery

## 2013-01-27 ENCOUNTER — Encounter (INDEPENDENT_AMBULATORY_CARE_PROVIDER_SITE_OTHER): Payer: Self-pay | Admitting: Surgery

## 2013-01-27 VITALS — BP 126/77 | HR 79 | Temp 97.4°F | Resp 16 | Ht 60.0 in | Wt 146.6 lb

## 2013-01-27 DIAGNOSIS — Z09 Encounter for follow-up examination after completed treatment for conditions other than malignant neoplasm: Secondary | ICD-10-CM

## 2013-01-27 NOTE — Progress Notes (Signed)
Crystal Padilla    [id number] 01/27/2013    [birth year]   CC: Post op lumpectomy and sentinel left node, right breast  HPI: The patient returns for post op follow-up. She underwent a a right partial mastectomy with lymph node evaluation on 11/24/12. Over all she feels that she is doing well. After I saw her she developed generalized beast swelling and redness and was treated with antibiiotics. The surgical site in the upper outer quadran never became infected and I suspect this was breast lymphedema, and it has improved. She has been off antibiotics for three weeks  PE: VITAL SIGNS: BP 126/77  Pulse 79  Temp(Src) 97.4 F (36.3 C) (Temporal)  Resp 16  Ht 5' (1.524 m)  Wt 146 lb 9.6 oz (66.497 kg)  BMI 28.63 kg/m2  The incision is healing nicely and there is no evidence of infection or hematoma.There is some diffuse edema, nost prominent medially and some increased pigmentation, almost as sometimes seen after radiation. The lumpectomy site is hard, but not fluctuant and may be just fat necrosis or perhaps seroma, although she didn't have a seroma at last visit.   IMPRESSION: Patient doing well.Improved mastitis/lymphedema of breast  PLAN: Her next visit will be in 4-6 weeks.She will be doing both chemo and radiation

## 2013-01-27 NOTE — Patient Instructions (Signed)
See me again in a month 

## 2013-02-03 ENCOUNTER — Encounter: Payer: Medicare Other | Admitting: Internal Medicine

## 2013-02-03 DIAGNOSIS — N61 Mastitis without abscess: Secondary | ICD-10-CM

## 2013-02-03 DIAGNOSIS — C50919 Malignant neoplasm of unspecified site of unspecified female breast: Secondary | ICD-10-CM

## 2013-02-16 ENCOUNTER — Telehealth (INDEPENDENT_AMBULATORY_CARE_PROVIDER_SITE_OTHER): Payer: Self-pay | Admitting: General Surgery

## 2013-02-16 NOTE — Telephone Encounter (Signed)
Left message on machine for patient to call back and ask for me. Returning patient's call.

## 2013-02-16 NOTE — Telephone Encounter (Signed)
Message copied by Liliana Cline on Tue Feb 16, 2013  9:02 AM ------      Message from: Consuelo Pandy      Created: Thu Feb 11, 2013  4:41 PM      Regarding: Call      Contact: [phone]       Please call patient when you get time.            Thanks      Haig Prophet ------

## 2013-02-18 ENCOUNTER — Encounter: Payer: Medicare Other | Admitting: Internal Medicine

## 2013-02-18 DIAGNOSIS — N61 Mastitis without abscess: Secondary | ICD-10-CM

## 2013-02-18 DIAGNOSIS — C50919 Malignant neoplasm of unspecified site of unspecified female breast: Secondary | ICD-10-CM

## 2013-02-23 ENCOUNTER — Ambulatory Visit (INDEPENDENT_AMBULATORY_CARE_PROVIDER_SITE_OTHER): Payer: Medicare Other | Admitting: Surgery

## 2013-02-23 ENCOUNTER — Telehealth (INDEPENDENT_AMBULATORY_CARE_PROVIDER_SITE_OTHER): Payer: Self-pay | Admitting: General Surgery

## 2013-02-23 ENCOUNTER — Encounter (INDEPENDENT_AMBULATORY_CARE_PROVIDER_SITE_OTHER): Payer: Self-pay | Admitting: Surgery

## 2013-02-23 VITALS — BP 112/72 | HR 76 | Temp 97.3°F | Resp 12 | Ht 60.0 in | Wt 148.0 lb

## 2013-02-23 DIAGNOSIS — I890 Lymphedema, not elsewhere classified: Secondary | ICD-10-CM

## 2013-02-23 DIAGNOSIS — R609 Edema, unspecified: Secondary | ICD-10-CM

## 2013-02-23 DIAGNOSIS — Z9889 Other specified postprocedural states: Secondary | ICD-10-CM

## 2013-02-23 NOTE — Patient Instructions (Signed)
We will arrange a mammogram and make decisions about next steps after that

## 2013-02-23 NOTE — Telephone Encounter (Signed)
Spoke with patient she is aware of  Unilat MM diagnostic at [facility] .Crystal Padilla

## 2013-02-23 NOTE — Progress Notes (Signed)
Crystal Padilla    [id number] 02/23/2013    [birth year]   CC: Post op lumpectomy and sentinel left node, right breast  HPI: The patient returns for post op follow-up. She underwent a a right partial mastectomy with lymph node evaluation on 11/24/12. She continues to have breast discomfort and swelling, with edema of the skin noticeable. She has not improved despite several courses of antibioitics  PE: VITAL SIGNS: BP 112/72  Pulse 76  Temp(Src) 97.3 F (36.3 C) (Temporal)  Resp 12  Ht 5' (1.524 m)  Wt 148 lb (67.132 kg)  BMI 28.9 kg/m2  The incision is healing nicely and there is no evidence of infection, but there is likely a seroma present.There is some diffuse edema, nost prominent medially and some increased pigmentation, almost as sometimes seen after radiation.   DATA REVIEWED: I looked over the ultrasound done in [facility] with DrEagle. There is a small seroma present at the lumpectomy site and some skin thckening, but no other abnormality noted   IMPRESSION: Chronic seroma, but I think most of this is lymphedema of the breast.   PLAN: Will get a mammogram of the right to see if anything else shows up. If negative can get a skin punch biopsy and try to aspirate the seroma, but I think this will not help. An MRI might help, but Dr Deboraha Sprang didn't think it would be likely to show anything other than the edema

## 2013-03-04 ENCOUNTER — Encounter (INDEPENDENT_AMBULATORY_CARE_PROVIDER_SITE_OTHER): Payer: Self-pay

## 2013-03-05 ENCOUNTER — Telehealth (INDEPENDENT_AMBULATORY_CARE_PROVIDER_SITE_OTHER): Payer: Self-pay | Admitting: General Surgery

## 2013-03-05 ENCOUNTER — Encounter (INDEPENDENT_AMBULATORY_CARE_PROVIDER_SITE_OTHER): Payer: Self-pay

## 2013-03-05 NOTE — Telephone Encounter (Signed)
Appt made with patient to come in for possible punch biopsies and core biopsies of area in the breast.

## 2013-03-15 ENCOUNTER — Encounter (INDEPENDENT_AMBULATORY_CARE_PROVIDER_SITE_OTHER): Payer: Self-pay | Admitting: Surgery

## 2013-03-15 ENCOUNTER — Ambulatory Visit (INDEPENDENT_AMBULATORY_CARE_PROVIDER_SITE_OTHER): Payer: Medicare Other | Admitting: Surgery

## 2013-03-15 VITALS — BP 132/86 | HR 84 | Temp 98.2°F | Resp 20 | Ht 59.5 in | Wt 151.0 lb

## 2013-03-15 DIAGNOSIS — I890 Lymphedema, not elsewhere classified: Secondary | ICD-10-CM

## 2013-03-15 NOTE — Progress Notes (Signed)
She comes in today for a skin punch biopsy of the right breast and a needle core biopsy of some reddened tissue the right breast. She is in his lymphedema but we need to rule out some ongoing carcinomatous-type process.   Procedure note  I confirmed the plans with the patient and marked the site of the right breast for the biopsy. The area was anesthetized with 1% Xylocaine with epinephrine. A 3 mm punch biopsy was taken. This is placed in formalin to be sent to pathology. Using a small opening I then did 3 needle core biopsies. Her breast seems to primarily fatty. This was done in the 3:00 position about 2 cm medial to the areolar margin.  Sterile dressing was applied. The patient tolerated  the procedure well. I will call her with the pathology report.

## 2013-03-15 NOTE — Addendum Note (Signed)
Addended byLiliana Cline on: 03/15/2013 12:04 PM   Modules accepted: Orders

## 2013-03-15 NOTE — Patient Instructions (Addendum)
He may shower tomorrow. When the Steri-Strip comments loose it may be removed. We will call you as soon as we have a pathology report on today's biopsies.

## 2013-03-16 ENCOUNTER — Telehealth (INDEPENDENT_AMBULATORY_CARE_PROVIDER_SITE_OTHER): Payer: Self-pay | Admitting: General Surgery

## 2013-03-16 NOTE — Telephone Encounter (Signed)
Patient made aware pathology negative. She will call with any questions.

## 2013-03-16 NOTE — Telephone Encounter (Signed)
Message copied by Liliana Cline on Tue Mar 16, 2013  2:06 PM ------      Message from: Currie Paris      Created: Tue Mar 16, 2013  1:49 PM       Tell her path is benign and as expected ------

## 2013-03-17 ENCOUNTER — Telehealth (INDEPENDENT_AMBULATORY_CARE_PROVIDER_SITE_OTHER): Payer: Self-pay | Admitting: *Deleted

## 2013-03-17 DIAGNOSIS — I890 Lymphedema, not elsewhere classified: Secondary | ICD-10-CM

## 2013-03-17 MED ORDER — DOXYCYCLINE HYCLATE 50 MG PO CAPS: 100.0000 mg | ORAL_CAPSULE | Freq: Two times a day (BID) | ORAL | Status: DC

## 2013-03-17 NOTE — Telephone Encounter (Signed)
I called patient back to let her know Dr Jamey Ripa wants her to take the benadryl and he wants to call in doxy 100mg  bid for 10 days for her. I have put the prescription in and sent electronically to her pharmacy.

## 2013-03-17 NOTE — Telephone Encounter (Signed)
Patient called to state she has a blister coming up under the steri strips.  It sounds as though patient is having an allergic reaction to either the steri strips or skin prep.  Encouraged patient to go ahead and start on Benadryl.  Patient states understanding at this time.  Any other ideas?

## 2013-03-23 ENCOUNTER — Encounter: Payer: Medicare Other | Admitting: Internal Medicine

## 2013-03-23 DIAGNOSIS — C50919 Malignant neoplasm of unspecified site of unspecified female breast: Secondary | ICD-10-CM

## 2013-03-23 DIAGNOSIS — Z170 Estrogen receptor positive status [ER+]: Secondary | ICD-10-CM

## 2013-04-06 ENCOUNTER — Other Ambulatory Visit (HOSPITAL_COMMUNITY): Payer: Self-pay | Admitting: Internal Medicine

## 2013-04-06 DIAGNOSIS — Z853 Personal history of malignant neoplasm of breast: Secondary | ICD-10-CM

## 2013-04-13 ENCOUNTER — Ambulatory Visit
Admission: RE | Admit: 2013-04-13 | Discharge: 2013-04-13 | Disposition: A | Discharge status: Home / Self Care | Payer: Medicare Other | Source: Ambulatory Visit | Attending: Internal Medicine | Admitting: Internal Medicine

## 2013-04-13 DIAGNOSIS — Z853 Personal history of malignant neoplasm of breast: Secondary | ICD-10-CM

## 2013-04-13 MED ORDER — GADOBENATE DIMEGLUMINE 529 MG/ML IV SOLN
13.0000 mL | Freq: Once | INTRAVENOUS | Status: AC | PRN
  Administered 2013-04-13: 13 mL via INTRAVENOUS

## 2013-04-16 ENCOUNTER — Encounter (INDEPENDENT_AMBULATORY_CARE_PROVIDER_SITE_OTHER): Payer: Self-pay | Admitting: Surgery

## 2013-04-16 ENCOUNTER — Ambulatory Visit (INDEPENDENT_AMBULATORY_CARE_PROVIDER_SITE_OTHER): Payer: Medicare Other | Admitting: Surgery

## 2013-04-16 VITALS — BP 130/82 | HR 72 | Temp 98.6°F | Resp 18 | Ht 59.5 in | Wt 150.5 lb

## 2013-04-16 DIAGNOSIS — I890 Lymphedema, not elsewhere classified: Secondary | ICD-10-CM

## 2013-04-16 NOTE — Progress Notes (Signed)
NAME: Crystal Padilla       DOB: 12-27-1945           DATE: 04/16/2013       ZOX:[id number]  CC:  Chief Complaint  Patient presents with  . Breast Cancer    HPI: the patient comes back for followup. She is about 5 months postop lumpectomy. She has had ongoing problems with breast edema and erythema. She has had multiple courses of antibiotics. The data and erythema persists although occasionally the erythema is less after some antibiotics. She has not had systemic symptoms of infection. I have done skin biopsies and a random breast biopsy to see if there is any early recurrence of her breast cancer. She's had a mammogram in recently and MRI which are negative for recurrence. The MRI does show a lot of edema. She recently developed a "blister" on the medial aspect of her right breast and some fluid was drained. She comes back today for followup of this. She's not had her post lumpectomy radiation or any other treatment because of ongoing issues with her breast.  EXAM: Vital signs: BP 130/82  Pulse 72  Temp(Src) 98.6 F (37 C) (Oral)  Resp 18  Ht 4' 11.5" (1.511 m)  Wt 150 lb 8 oz (68.266 kg)  BMI 29.9 kg/m2  General: Patient alert, oriented, NAD  The right breast remains larger than the left. There is mild edema. There is mild redness but not warm to the touch. It is not particularly tender. There is no mass. 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 One option would be to do a mastectomy, which would obviate the need for radiation therapy. I'll plan to see her back in 4 weeks.  Romir Klimowicz J 04/16/2013

## 2013-04-16 NOTE — Patient Instructions (Signed)
We will arrange a physical therapy consultation to see if they can make some suggestions for management of swelling of your right breast.

## 2013-04-21 ENCOUNTER — Encounter: Payer: Medicare Other | Admitting: Internal Medicine

## 2013-04-21 DIAGNOSIS — C50919 Malignant neoplasm of unspecified site of unspecified female breast: Secondary | ICD-10-CM

## 2013-05-14 ENCOUNTER — Encounter (INDEPENDENT_AMBULATORY_CARE_PROVIDER_SITE_OTHER): Payer: Medicare Other | Admitting: Surgery

## 2013-05-19 ENCOUNTER — Encounter: Payer: Medicare Other | Admitting: Internal Medicine

## 2013-05-19 DIAGNOSIS — C50919 Malignant neoplasm of unspecified site of unspecified female breast: Secondary | ICD-10-CM

## 2013-05-19 DIAGNOSIS — Z171 Estrogen receptor negative status [ER-]: Secondary | ICD-10-CM

## 2013-05-19 DIAGNOSIS — I890 Lymphedema, not elsewhere classified: Secondary | ICD-10-CM

## 2013-05-25 ENCOUNTER — Other Ambulatory Visit: Payer: Self-pay | Admitting: Radiology

## 2013-05-25 ENCOUNTER — Encounter (HOSPITAL_COMMUNITY): Payer: Self-pay

## 2013-05-25 ENCOUNTER — Ambulatory Visit (HOSPITAL_COMMUNITY)
Admission: RE | Admit: 2013-05-25 | Discharge: 2013-05-25 | Disposition: A | Discharge status: Home / Self Care | Payer: Medicare Other | Source: Ambulatory Visit | Attending: Internal Medicine | Admitting: Internal Medicine

## 2013-05-25 ENCOUNTER — Other Ambulatory Visit (HOSPITAL_COMMUNITY): Payer: Self-pay | Admitting: Internal Medicine

## 2013-05-25 DIAGNOSIS — K219 Gastro-esophageal reflux disease without esophagitis: Secondary | ICD-10-CM | POA: Insufficient documentation

## 2013-05-25 DIAGNOSIS — C50919 Malignant neoplasm of unspecified site of unspecified female breast: Secondary | ICD-10-CM

## 2013-05-25 DIAGNOSIS — I10 Essential (primary) hypertension: Secondary | ICD-10-CM | POA: Insufficient documentation

## 2013-05-25 DIAGNOSIS — M899 Disorder of bone, unspecified: Secondary | ICD-10-CM | POA: Insufficient documentation

## 2013-05-25 DIAGNOSIS — M949 Disorder of cartilage, unspecified: Secondary | ICD-10-CM | POA: Insufficient documentation

## 2013-05-25 LAB — CBC WITH DIFFERENTIAL/PLATELET
Basophils Absolute: 0 10*3/uL (ref 0.0–0.1)
HCT: 37 % (ref 36.0–46.0)
Lymphocytes Relative: 45 % (ref 12–46)
Monocytes Relative: 7 % (ref 3–12)
Neutro Abs: 3.7 10*3/uL (ref 1.7–7.7)
Neutrophils Relative %: 47 % (ref 43–77)
RDW: 12.9 % (ref 11.5–15.5)
WBC: 7.8 10*3/uL (ref 4.0–10.5)

## 2013-05-25 LAB — APTT: aPTT: 29 seconds (ref 24–37)

## 2013-05-25 LAB — PROTIME-INR
INR: 1.01 (ref 0.00–1.49)
Prothrombin Time: 13.2 seconds (ref 11.6–15.2)

## 2013-05-25 MED ORDER — MIDAZOLAM HCL 2 MG/2ML IJ SOLN
INTRAMUSCULAR | Status: AC
  Filled 2013-05-25: qty 6

## 2013-05-25 MED ORDER — FENTANYL CITRATE 0.05 MG/ML IJ SOLN
INTRAMUSCULAR | Status: AC | PRN
  Administered 2013-05-25: 100 ug via INTRAVENOUS
  Administered 2013-05-25: 50 ug via INTRAVENOUS

## 2013-05-25 MED ORDER — LIDOCAINE HCL 1 % IJ SOLN
INTRAMUSCULAR | Status: AC
  Filled 2013-05-25: qty 20

## 2013-05-25 MED ORDER — SODIUM CHLORIDE 0.9 % IV SOLN
INTRAVENOUS | Status: DC
  Administered 2013-05-25: 13:00:00 via INTRAVENOUS

## 2013-05-25 MED ORDER — VANCOMYCIN HCL IN DEXTROSE 1-5 GM/200ML-% IV SOLN
INTRAVENOUS | Status: AC
  Filled 2013-05-25: qty 200

## 2013-05-25 MED ORDER — MIDAZOLAM HCL 2 MG/2ML IJ SOLN
INTRAMUSCULAR | Status: AC | PRN
  Administered 2013-05-25: 1 mg via INTRAVENOUS
  Administered 2013-05-25: 0.5 mg via INTRAVENOUS
  Administered 2013-05-25: 1 mg via INTRAVENOUS

## 2013-05-25 MED ORDER — FENTANYL CITRATE 0.05 MG/ML IJ SOLN
INTRAMUSCULAR | Status: AC
  Filled 2013-05-25: qty 6

## 2013-05-25 MED ORDER — VANCOMYCIN HCL IN DEXTROSE 1-5 GM/200ML-% IV SOLN
1000.0000 mg | Freq: Once | INTRAVENOUS | Status: AC
  Administered 2013-05-25: 1000 mg via INTRAVENOUS

## 2013-05-25 MED ORDER — HEPARIN SOD (PORK) LOCK FLUSH 100 UNIT/ML IV SOLN
INTRAVENOUS | Status: AC | PRN
  Administered 2013-05-25: 500 [IU]

## 2013-05-25 NOTE — Procedures (Signed)
LIJV PAC Tip SVC RA No comp

## 2013-05-25 NOTE — H&P (Signed)
Chief Complaint: "I'm here for a port" Referring Physician:Darovsky HPI: Crystal Padilla is an 67 y.o. female with breast cancer. She had an attempted port placement yesterday but they were unsuccessful due to possible obstruction in her distal SVC. She is referred to IR for placement of Port. According to the referring MD notes, she is also to receive radiation treatment to her right breast at some point as well. PMHx and meds reviewed.   Past Medical History:  Past Medical History  Diagnosis Date  . Family history of anesthesia complication     mother was hard to wake up  . Cancer     br ca  . Hypertension   . Osteopenia   . Arthritis   . GERD (gastroesophageal reflux disease)   . Wears glasses     Past Surgical History:  Past Surgical History  Procedure Laterality Date  . Abdominal hysterectomy  1980  . Thyroidectomy  1980  . Osteotomy ulna  1998  . Appendectomy    . Tubal ligation    . Osteotomy and ulnar shortening  1998    rt  . Knee arthroscopy      left  . Tonsillectomy    . Breast lumpectomy with needle localization and axillary sentinel lymph node bx  11/24/2012    Procedure: BREAST LUMPECTOMY WITH NEEDLE LOCALIZATION AND AXILLARY SENTINEL LYMPH NODE BX;  Surgeon: Currie Paris, MD;  Location: [facility];  Service: General;  Laterality: Right;    Family History:  Family History  Problem Relation Age of Onset  . Cancer Mother     breast  . Heart disease Sister   . Hypertension Maternal Grandmother     Social History:  reports that she has been smoking.  She has never used smokeless tobacco. She reports that  drinks alcohol. She reports that she does not use illicit drugs.  Allergies:  Allergies  Allergen Reactions  . Asa (Aspirin) Palpitations    Speeds heart up  . Chocolate Rash  . Penicillins Hives    Medications: alendronate (FOSAMAX) 70 MG tablet (Taking) Sig - Route: Take 70 mg by mouth every 7 (seven) days. Take with a full  glass of water on an empty stomach. - Oral Class: Historical Med amLODipine (NORVASC) 5 MG tablet (Taking) Sig - Route: Take 5 mg by mouth daily. - Oral Class: Historical Med calcium citrate (CALCITRATE - DOSED IN MG ELEMENTAL CALCIUM) 950 MG tablet (Taking) Sig - Route: Take 1 tablet by mouth daily. - Oral Class: Historical Med fish oil-omega-3 fatty acids 1000 MG capsule (Taking) Sig - Route: Take 2 g by mouth daily. - Oral Class: Historical Med omeprazole (PRILOSEC) 20 MG capsule (Taking) Sig - Route: Take 20 mg by mouth as needed    Please HPI for pertinent positives, otherwise complete 10 system ROS negative.  Physical Exam: Temp: 97, HR: 87, BP: 125/83, O2: 96%, RR: 18   General Appearance:  Alert, cooperative, no distress, appears stated age  Head:  Normocephalic, without obvious abnormality, atraumatic  ENT: Unremarkable  Neck: Supple, symmetrical, trachea midline  Lungs:   Clear to auscultation bilaterally, no w/r/r, respirations unlabored without use of accessory muscles.  Chest Wall:  No tenderness or deformity  Heart:  Regular rate and rhythm, S1, S2 normal, no murmur, rub or gallop.  Neurologic: Normal affect, no gross deficits.   Results for orders placed during the hospital encounter of 05/25/13 (from the past 48 hour(s))  APTT     Status:  None   Collection Time    05/25/13 12:45 PM      Result Value Range   aPTT 29  24 - 37 seconds  PROTIME-INR     Status: None   Collection Time    05/25/13 12:45 PM      Result Value Range   Prothrombin Time 13.2  11.6 - 15.2 seconds   INR 1.01  0.00 - 1.49   No results found.  Assessment/Plan Right breast cancer For Port placement. Discussed procedure, risks, complications, use of sedation. Labs reviewed. Consent signed in chart  Brayton El PA-C 05/25/2013, 1:06 PM

## 2013-05-25 NOTE — Procedures (Deleted)
RIJV PAC Tip SVC RA No comp 

## 2013-05-26 DIAGNOSIS — Z09 Encounter for follow-up examination after completed treatment for conditions other than malignant neoplasm: Secondary | ICD-10-CM

## 2013-05-27 DIAGNOSIS — C50919 Malignant neoplasm of unspecified site of unspecified female breast: Secondary | ICD-10-CM

## 2013-05-27 DIAGNOSIS — Z5111 Encounter for antineoplastic chemotherapy: Secondary | ICD-10-CM

## 2013-05-28 DIAGNOSIS — D702 Other drug-induced agranulocytosis: Secondary | ICD-10-CM

## 2013-06-03 ENCOUNTER — Encounter: Payer: Medicare Other | Admitting: Internal Medicine

## 2013-06-03 DIAGNOSIS — C50919 Malignant neoplasm of unspecified site of unspecified female breast: Secondary | ICD-10-CM

## 2013-06-03 DIAGNOSIS — Z171 Estrogen receptor negative status [ER-]: Secondary | ICD-10-CM

## 2013-06-03 DIAGNOSIS — D709 Neutropenia, unspecified: Secondary | ICD-10-CM

## 2013-06-03 DIAGNOSIS — E876 Hypokalemia: Secondary | ICD-10-CM

## 2013-06-07 ENCOUNTER — Telehealth (INDEPENDENT_AMBULATORY_CARE_PROVIDER_SITE_OTHER): Payer: Self-pay

## 2013-06-07 NOTE — Telephone Encounter (Signed)
Called pt to schedule next office visit with Dr. Jamey Ripa.  Per Dr. Weldon Inches 04/16/13 office visit note, he wanted to see pt back in 4 weeks.  Pt stated that she does not wish to schedule at this time.

## 2013-06-09 DIAGNOSIS — C50919 Malignant neoplasm of unspecified site of unspecified female breast: Secondary | ICD-10-CM

## 2013-06-09 DIAGNOSIS — Z5111 Encounter for antineoplastic chemotherapy: Secondary | ICD-10-CM

## 2013-06-10 DIAGNOSIS — T451X5A Adverse effect of antineoplastic and immunosuppressive drugs, initial encounter: Secondary | ICD-10-CM

## 2013-06-10 DIAGNOSIS — D702 Other drug-induced agranulocytosis: Secondary | ICD-10-CM

## 2013-06-10 DIAGNOSIS — C50919 Malignant neoplasm of unspecified site of unspecified female breast: Secondary | ICD-10-CM

## 2013-06-24 DIAGNOSIS — C50919 Malignant neoplasm of unspecified site of unspecified female breast: Secondary | ICD-10-CM

## 2013-06-24 DIAGNOSIS — Z5111 Encounter for antineoplastic chemotherapy: Secondary | ICD-10-CM

## 2013-06-25 DIAGNOSIS — C50919 Malignant neoplasm of unspecified site of unspecified female breast: Secondary | ICD-10-CM

## 2013-06-25 DIAGNOSIS — D702 Other drug-induced agranulocytosis: Secondary | ICD-10-CM

## 2013-07-01 DIAGNOSIS — K141 Geographic tongue: Secondary | ICD-10-CM

## 2013-07-01 DIAGNOSIS — C50919 Malignant neoplasm of unspecified site of unspecified female breast: Secondary | ICD-10-CM

## 2013-07-01 DIAGNOSIS — E871 Hypo-osmolality and hyponatremia: Secondary | ICD-10-CM

## 2013-07-01 DIAGNOSIS — R7309 Other abnormal glucose: Secondary | ICD-10-CM

## 2013-07-08 DIAGNOSIS — Z5111 Encounter for antineoplastic chemotherapy: Secondary | ICD-10-CM

## 2013-07-08 DIAGNOSIS — C50919 Malignant neoplasm of unspecified site of unspecified female breast: Secondary | ICD-10-CM

## 2013-07-09 DIAGNOSIS — D702 Other drug-induced agranulocytosis: Secondary | ICD-10-CM

## 2013-07-14 ENCOUNTER — Other Ambulatory Visit: Payer: Self-pay

## 2013-07-14 DIAGNOSIS — C50919 Malignant neoplasm of unspecified site of unspecified female breast: Secondary | ICD-10-CM

## 2013-08-05 DIAGNOSIS — Z5111 Encounter for antineoplastic chemotherapy: Secondary | ICD-10-CM

## 2013-08-05 DIAGNOSIS — C50919 Malignant neoplasm of unspecified site of unspecified female breast: Secondary | ICD-10-CM

## 2013-08-12 DIAGNOSIS — Z5111 Encounter for antineoplastic chemotherapy: Secondary | ICD-10-CM

## 2013-08-12 DIAGNOSIS — C50919 Malignant neoplasm of unspecified site of unspecified female breast: Secondary | ICD-10-CM

## 2013-08-19 DIAGNOSIS — T451X5A Adverse effect of antineoplastic and immunosuppressive drugs, initial encounter: Secondary | ICD-10-CM

## 2013-08-19 DIAGNOSIS — Z5111 Encounter for antineoplastic chemotherapy: Secondary | ICD-10-CM

## 2013-08-19 DIAGNOSIS — C50919 Malignant neoplasm of unspecified site of unspecified female breast: Secondary | ICD-10-CM

## 2013-08-19 DIAGNOSIS — D6481 Anemia due to antineoplastic chemotherapy: Secondary | ICD-10-CM

## 2013-08-26 DIAGNOSIS — Z5111 Encounter for antineoplastic chemotherapy: Secondary | ICD-10-CM

## 2013-08-26 DIAGNOSIS — C50919 Malignant neoplasm of unspecified site of unspecified female breast: Secondary | ICD-10-CM

## 2013-09-02 DIAGNOSIS — C50919 Malignant neoplasm of unspecified site of unspecified female breast: Secondary | ICD-10-CM

## 2013-09-02 DIAGNOSIS — Z5111 Encounter for antineoplastic chemotherapy: Secondary | ICD-10-CM

## 2013-09-09 DIAGNOSIS — D473 Essential (hemorrhagic) thrombocythemia: Secondary | ICD-10-CM

## 2013-09-09 DIAGNOSIS — R7309 Other abnormal glucose: Secondary | ICD-10-CM

## 2013-09-09 DIAGNOSIS — Z5111 Encounter for antineoplastic chemotherapy: Secondary | ICD-10-CM

## 2013-09-09 DIAGNOSIS — C50919 Malignant neoplasm of unspecified site of unspecified female breast: Secondary | ICD-10-CM

## 2013-09-09 DIAGNOSIS — G589 Mononeuropathy, unspecified: Secondary | ICD-10-CM

## 2013-09-16 DIAGNOSIS — R112 Nausea with vomiting, unspecified: Secondary | ICD-10-CM

## 2013-09-16 DIAGNOSIS — R197 Diarrhea, unspecified: Secondary | ICD-10-CM

## 2013-09-16 DIAGNOSIS — D72829 Elevated white blood cell count, unspecified: Secondary | ICD-10-CM

## 2013-09-16 DIAGNOSIS — D473 Essential (hemorrhagic) thrombocythemia: Secondary | ICD-10-CM

## 2013-09-16 DIAGNOSIS — C50919 Malignant neoplasm of unspecified site of unspecified female breast: Secondary | ICD-10-CM

## 2013-09-16 DIAGNOSIS — R509 Fever, unspecified: Secondary | ICD-10-CM

## 2013-09-17 DIAGNOSIS — C50919 Malignant neoplasm of unspecified site of unspecified female breast: Secondary | ICD-10-CM

## 2013-09-23 DIAGNOSIS — C50919 Malignant neoplasm of unspecified site of unspecified female breast: Secondary | ICD-10-CM

## 2013-09-23 DIAGNOSIS — Z5111 Encounter for antineoplastic chemotherapy: Secondary | ICD-10-CM

## 2013-09-29 DIAGNOSIS — C50919 Malignant neoplasm of unspecified site of unspecified female breast: Secondary | ICD-10-CM

## 2013-09-29 DIAGNOSIS — Z5111 Encounter for antineoplastic chemotherapy: Secondary | ICD-10-CM

## 2013-10-07 DIAGNOSIS — Z5111 Encounter for antineoplastic chemotherapy: Secondary | ICD-10-CM

## 2013-10-07 DIAGNOSIS — C50919 Malignant neoplasm of unspecified site of unspecified female breast: Secondary | ICD-10-CM

## 2013-10-14 ENCOUNTER — Other Ambulatory Visit: Payer: Self-pay

## 2013-10-14 DIAGNOSIS — Z5111 Encounter for antineoplastic chemotherapy: Secondary | ICD-10-CM

## 2013-10-14 DIAGNOSIS — C50919 Malignant neoplasm of unspecified site of unspecified female breast: Secondary | ICD-10-CM

## 2013-10-21 DIAGNOSIS — C50919 Malignant neoplasm of unspecified site of unspecified female breast: Secondary | ICD-10-CM

## 2013-10-21 DIAGNOSIS — Z5111 Encounter for antineoplastic chemotherapy: Secondary | ICD-10-CM

## 2013-10-28 DIAGNOSIS — Z5111 Encounter for antineoplastic chemotherapy: Secondary | ICD-10-CM

## 2013-10-28 DIAGNOSIS — C50919 Malignant neoplasm of unspecified site of unspecified female breast: Secondary | ICD-10-CM

## 2015-06-05 ENCOUNTER — Other Ambulatory Visit: Payer: Self-pay

## 2015-07-13 ENCOUNTER — Ambulatory Visit (INDEPENDENT_AMBULATORY_CARE_PROVIDER_SITE_OTHER): Payer: Medicare Other | Admitting: Otolaryngology

## 2015-07-13 ENCOUNTER — Other Ambulatory Visit (INDEPENDENT_AMBULATORY_CARE_PROVIDER_SITE_OTHER): Payer: Self-pay | Admitting: Otolaryngology

## 2015-07-13 DIAGNOSIS — D440 Neoplasm of uncertain behavior of thyroid gland: Secondary | ICD-10-CM | POA: Diagnosis not present

## 2015-07-13 DIAGNOSIS — E079 Disorder of thyroid, unspecified: Secondary | ICD-10-CM

## 2015-07-19 ENCOUNTER — Encounter (HOSPITAL_COMMUNITY): Payer: Self-pay

## 2015-07-19 ENCOUNTER — Ambulatory Visit (HOSPITAL_COMMUNITY)
Admission: RE | Admit: 2015-07-19 | Discharge: 2015-07-19 | Disposition: A | Discharge status: Home / Self Care | Payer: Medicare Other | Source: Ambulatory Visit | Attending: Otolaryngology | Admitting: Otolaryngology

## 2015-07-19 DIAGNOSIS — E041 Nontoxic single thyroid nodule: Secondary | ICD-10-CM | POA: Diagnosis present

## 2015-07-19 DIAGNOSIS — E079 Disorder of thyroid, unspecified: Secondary | ICD-10-CM

## 2015-07-19 DIAGNOSIS — E890 Postprocedural hypothyroidism: Secondary | ICD-10-CM | POA: Diagnosis not present

## 2015-07-19 MED ORDER — LIDOCAINE HCL (PF) 2 % IJ SOLN
INTRAMUSCULAR | Status: AC
  Filled 2015-07-19: qty 10

## 2015-07-19 NOTE — Procedures (Signed)
PreOperative Dx: RIGHT thyroid nodule Postoperative Dx: RIGHT thyroid nodule Procedure:   US guided FNA of RIGHT thyroid nodule Radiologist:  Thornton Papas Anesthesia:  2 ml of 2% lidocaine Specimen:  FNA x 3  EBL:   < 1 ml Complications: None

## 2015-07-19 NOTE — Discharge Instructions (Signed)
Thyroid Biopsy °The thyroid gland is a butterfly-shaped gland situated in the front of the neck. It produces hormones which affect metabolism, growth and development, and body temperature. A thyroid biopsy is a procedure in which small samples of tissue or fluid are removed from the thyroid gland or mass and examined under a microscope. This test is done to determine the cause of thyroid problems, such as infection, cancer, or other thyroid problems. °There are 2 ways to obtain samples: °1. Fine needle biopsy. Samples are removed using a thin needle inserted through the skin and into the thyroid gland or mass. °2. Open biopsy. Samples are removed after a cut (incision) is made through the skin. °LET YOUR CAREGIVER KNOW ABOUT:  °· Allergies. °· Medications taken including herbs, eye drops, over-the-counter medications, and creams. °· Use of steroids (by mouth or creams). °· Previous problems with anesthetics or numbing medicine. °· Possibility of pregnancy, if this applies. °· History of blood clots (thrombophlebitis). °· History of bleeding or blood problems. °· Previous surgery. °· Other health problems. °RISKS AND COMPLICATIONS °· Bleeding from the site. The risk of bleeding is higher if you have a bleeding disorder or are taking any blood thinning medications (anticoagulants). °· Infection. °· Injury to structures near the thyroid gland. °BEFORE THE PROCEDURE  °This is a procedure that can be done as an outpatient. Confirm the time that you need to arrive for your procedure. Confirm whether there is a need to fast or withhold any medications. A blood sample may be done to determine your blood clotting time. Medicine may be given to help you relax (sedative). °PROCEDURE °Fine needle biopsy. °You will be awake during the procedure. You may be asked to lie on your back with your head tipped backward to extend your neck. Let your caregiver know if you cannot tolerate the positioning. An area on your neck will be  cleansed. A needle is inserted through the skin of your neck. You may feel a mild discomfort during this procedure. You may be asked to avoid coughing, talking, swallowing, or making sounds during some portions of the procedure. The needle is withdrawn once tissue or fluid samples have been removed. Pressure may be applied to the neck to reduce swelling and ensure that bleeding has stopped. The samples will be sent for examination.  °Open biopsy. °You will be given general anesthesia. You will be asleep during the procedure. An incision is made in your neck. A sample of thyroid tissue or the mass is removed. The tissue sample or mass will be sent for examination. The sample or mass may be examined during the biopsy. If the sample or mass contains cancer cells, some or all of the thyroid gland may be removed. The incision is closed with stitches. °AFTER THE PROCEDURE  °Your recovery will be assessed and monitored. If there are no problems, as an outpatient, you should be able to go home shortly after the procedure. °If you had a fine needle biopsy: °· You may have soreness at the biopsy site for 1 to 2 days. °If you had an open biopsy:  °· You may have soreness at the biopsy site for 3 to 4 days. °· You may have a hoarse voice or sore throat for 1 to 2 days. °Obtaining the Test Results °It is your responsibility to obtain your test results. Do not assume everything is normal if you have not heard from your caregiver or the medical facility. It is important for you to follow up   on all of your test results. °HOME CARE INSTRUCTIONS  °· Keeping your head raised on a pillow when you are lying down may ease biopsy site discomfort. °· Supporting the back of your head and neck with both hands as you sit up from a lying position may ease biopsy site discomfort. °· Only take over-the-counter or prescription medicines for pain, discomfort, or fever as directed by your caregiver. °· Throat lozenges or gargling with warm salt  water may help to soothe a sore throat. °SEEK IMMEDIATE MEDICAL CARE IF:  °· You have severe bleeding from the biopsy site. °· You have difficulty swallowing. °· You have a fever. °· You have increased pain, swelling, redness, or warmth at the biopsy site. °· You notice pus coming from the biopsy site. °· You have swollen glands (lymph nodes) in your neck. °Document Released: 09/22/2007 Document Revised: 03/22/2013 Document Reviewed: 02/17/2014 °ExitCare® Patient Information ©2015 [employer]. This information is not intended to replace advice given to you by your health care provider. Make sure you discuss any questions you have with your health care provider. ° °

## 2015-08-03 ENCOUNTER — Ambulatory Visit (INDEPENDENT_AMBULATORY_CARE_PROVIDER_SITE_OTHER): Payer: Medicare Other | Admitting: Otolaryngology

## 2015-08-03 DIAGNOSIS — D440 Neoplasm of uncertain behavior of thyroid gland: Secondary | ICD-10-CM

## 2015-08-21 ENCOUNTER — Other Ambulatory Visit: Payer: Self-pay | Admitting: Otolaryngology

## 2015-09-18 NOTE — Pre-Procedure Instructions (Signed)
Shalie A Lein  09/18/2015     Your procedure is scheduled on : Wednesday September 27, 2015 at 10:00 AM.  Report to [facility] Admitting at 8:00 A.M.  Call this number if you have problems the morning of surgery: [phone]   Remember:  Do not eat food or drink liquids after midnight.  Take these medicines the morning of surgery with A SIP OF WATER : Acetaminophen (Tylenol) if needed   Stop taking any vitamins, herbal medications, Ibuprofen, Advil, Motrin, Aleve, etc on Wednesday October 12th   Do not wear jewelry, make-up or nail polish.  Do not wear lotions, powders, or perfumes.   Do not shave 48 hours prior to surgery.    Do not bring valuables to the hospital.  [facility] is not responsible for any belongings or valuables.  Contacts, dentures or bridgework may not be worn into surgery.  Leave your suitcase in the car.  After surgery it may be brought to your room.  For patients admitted to the hospital, discharge time will be determined by your treatment team.  Patients discharged the day of surgery will not be allowed to drive home.   Name and phone number of your driver:    Special instructions:  Shower using CHG soap the night before and the morning of your surgery  Please read over the following fact sheets that you were given. Pain Booklet, Coughing and Deep Breathing and Surgical Site Infection Prevention

## 2015-09-19 ENCOUNTER — Encounter (HOSPITAL_COMMUNITY): Payer: Self-pay

## 2015-09-19 ENCOUNTER — Ambulatory Visit (HOSPITAL_COMMUNITY)
Admission: RE | Admit: 2015-09-19 | Discharge: 2015-09-19 | Disposition: A | Discharge status: Home / Self Care | Payer: Medicare Other | Source: Ambulatory Visit | Attending: Otolaryngology | Admitting: Otolaryngology

## 2015-09-19 ENCOUNTER — Encounter (HOSPITAL_COMMUNITY)
Admission: RE | Admit: 2015-09-19 | Discharge: 2015-09-19 | Disposition: A | Discharge status: Home / Self Care | Payer: Medicare Other | Source: Ambulatory Visit | Attending: Otolaryngology | Admitting: Otolaryngology

## 2015-09-19 DIAGNOSIS — Z01812 Encounter for preprocedural laboratory examination: Secondary | ICD-10-CM | POA: Insufficient documentation

## 2015-09-19 DIAGNOSIS — F17200 Nicotine dependence, unspecified, uncomplicated: Secondary | ICD-10-CM | POA: Diagnosis not present

## 2015-09-19 DIAGNOSIS — K219 Gastro-esophageal reflux disease without esophagitis: Secondary | ICD-10-CM | POA: Insufficient documentation

## 2015-09-19 DIAGNOSIS — Z01818 Encounter for other preprocedural examination: Secondary | ICD-10-CM | POA: Diagnosis present

## 2015-09-19 DIAGNOSIS — Z7983 Long term (current) use of bisphosphonates: Secondary | ICD-10-CM | POA: Insufficient documentation

## 2015-09-19 DIAGNOSIS — I10 Essential (primary) hypertension: Secondary | ICD-10-CM | POA: Insufficient documentation

## 2015-09-19 DIAGNOSIS — Z79899 Other long term (current) drug therapy: Secondary | ICD-10-CM | POA: Diagnosis not present

## 2015-09-19 DIAGNOSIS — R9431 Abnormal electrocardiogram [ECG] [EKG]: Secondary | ICD-10-CM | POA: Insufficient documentation

## 2015-09-19 DIAGNOSIS — E041 Nontoxic single thyroid nodule: Secondary | ICD-10-CM | POA: Insufficient documentation

## 2015-09-19 HISTORY — DX: Bronchitis, not specified as acute or chronic: J40

## 2015-09-19 HISTORY — DX: Inflammatory liver disease, unspecified: K75.9

## 2015-09-19 LAB — COMPREHENSIVE METABOLIC PANEL
ALBUMIN: 3.8 g/dL (ref 3.5–5.0)
ALK PHOS: 96 U/L (ref 38–126)
ALT: 44 U/L (ref 14–54)
ANION GAP: 10 (ref 5–15)
AST: 38 U/L (ref 15–41)
BUN: <5 mg/dL — ABNORMAL LOW (ref 6–20)
CALCIUM: 9.2 mg/dL (ref 8.9–10.3)
CHLORIDE: 93 mmol/L — AB (ref 101–111)
CO2: 25 mmol/L (ref 22–32)
Creatinine, Ser: 0.55 mg/dL (ref 0.44–1.00)
GFR calc non Af Amer: >60 mL/min (ref >60)
GLUCOSE: 110 mg/dL — AB (ref 65–99)
POTASSIUM: 4.5 mmol/L (ref 3.5–5.1)
SODIUM: 128 mmol/L — AB (ref 135–145)
Total Bilirubin: 1.1 mg/dL (ref 0.3–1.2)
Total Protein: 7.2 g/dL (ref 6.5–8.1)

## 2015-09-19 LAB — CBC
HCT: 46.9 % — ABNORMAL HIGH (ref 36.0–46.0)
Hemoglobin: 17.3 g/dL — ABNORMAL HIGH (ref 12.0–15.0)
MCH: 31.9 pg (ref 26.0–34.0)
MCHC: 36.9 g/dL — AB (ref 30.0–36.0)
MCV: 86.5 fL (ref 78.0–100.0)
PLATELETS: 257 10*3/uL (ref 150–400)
RBC: 5.42 MIL/uL — ABNORMAL HIGH (ref 3.87–5.11)
RDW: 13.5 % (ref 11.5–15.5)
WBC: 6.4 10*3/uL (ref 4.0–10.5)

## 2015-09-19 NOTE — Progress Notes (Signed)
PCP is Dhruv Vyas  Patient denied having any acute cardiac or pulmonary issues  Patient informed Nurse that she take Amlodipine and Arimidex in the evening

## 2015-09-20 NOTE — Progress Notes (Signed)
Anesthesia Chart Review:  Pt is 69 year old female scheduled for completion of R thyroidectomy on 09/27/2015 with Dr. Benjamine Mola.   PCP is Dr. Jerene Bears.   PMH includes: HTN, hepatitis (unknown type, as a teenager), breast cancer, GERD. Current smoker. BMI 27. S/p breast lumpectomy 11/24/12.   Medications include: alendronate, amlodipine, arimidex.   Preoperative labs reviewed.  Na 128, Cl 93. Will check I-stat4 DOS.   Chest x-ray 09/19/2015 reviewed. No active cardiopulmonary disease.   EKG 09/19/2015: NSR. Inferior infarct, age undetermined. Possible Anterior infarct, age undetermined. No significant change from prior EKG dated 11/23/12 per Dr. Hassell Done interpretation.   If labs acceptable DOS, I anticipate pt can proceed with surgery as scheduled.   Willeen Cass, FNP-BC [facility]/Anesthesiology Phone: [phone] 09/20/2015 5:02 PM

## 2015-09-27 ENCOUNTER — Ambulatory Visit (HOSPITAL_COMMUNITY): Payer: Medicare Other | Admitting: Emergency Medicine

## 2015-09-27 ENCOUNTER — Ambulatory Visit (HOSPITAL_COMMUNITY)
Admission: RE | Admit: 2015-09-27 | Discharge: 2015-09-28 | Disposition: A | Discharge status: Home / Self Care | Payer: Medicare Other | Source: Ambulatory Visit | Attending: Otolaryngology | Admitting: Otolaryngology

## 2015-09-27 ENCOUNTER — Ambulatory Visit (HOSPITAL_COMMUNITY): Payer: Medicare Other | Admitting: Certified Registered Nurse Anesthetist

## 2015-09-27 ENCOUNTER — Encounter (HOSPITAL_COMMUNITY)
Admission: RE | Disposition: A | Discharge status: Home / Self Care | Payer: Self-pay | Source: Ambulatory Visit | Attending: Otolaryngology

## 2015-09-27 ENCOUNTER — Encounter (HOSPITAL_COMMUNITY): Payer: Self-pay | Admitting: Certified Registered Nurse Anesthetist

## 2015-09-27 DIAGNOSIS — F17200 Nicotine dependence, unspecified, uncomplicated: Secondary | ICD-10-CM | POA: Diagnosis not present

## 2015-09-27 DIAGNOSIS — E890 Postprocedural hypothyroidism: Secondary | ICD-10-CM

## 2015-09-27 DIAGNOSIS — E042 Nontoxic multinodular goiter: Secondary | ICD-10-CM | POA: Insufficient documentation

## 2015-09-27 DIAGNOSIS — D440 Neoplasm of uncertain behavior of thyroid gland: Secondary | ICD-10-CM | POA: Diagnosis not present

## 2015-09-27 HISTORY — PX: THYROIDECTOMY: SHX17

## 2015-09-27 HISTORY — DX: Nontoxic single thyroid nodule: E04.1

## 2015-09-27 LAB — POCT I-STAT 4, (NA,K, GLUC, HGB,HCT)
Glucose, Bld: 129 mg/dL — ABNORMAL HIGH (ref 65–99)
HEMATOCRIT: 49 % — AB (ref 36.0–46.0)
HEMOGLOBIN: 16.7 g/dL — AB (ref 12.0–15.0)
Potassium: 4 mmol/L (ref 3.5–5.1)
Sodium: 131 mmol/L — ABNORMAL LOW (ref 135–145)

## 2015-09-27 LAB — CALCIUM
Calcium: 8.6 mg/dL — ABNORMAL LOW (ref 8.9–10.3)
Calcium: 9.6 mg/dL (ref 8.9–10.3)

## 2015-09-27 SURGERY — THYROIDECTOMY
Anesthesia: General | Site: Neck

## 2015-09-27 MED ORDER — PROPOFOL 10 MG/ML IV BOLUS
INTRAVENOUS | Status: AC
  Filled 2015-09-27: qty 20

## 2015-09-27 MED ORDER — LACTATED RINGERS IV SOLN
INTRAVENOUS | Status: DC
  Administered 2015-09-27: 50 mL/h via INTRAVENOUS
  Administered 2015-09-27: 11:00:00 via INTRAVENOUS

## 2015-09-27 MED ORDER — FENTANYL CITRATE (PF) 250 MCG/5ML IJ SOLN
INTRAMUSCULAR | Status: DC | PRN
  Administered 2015-09-27: 150 ug via INTRAVENOUS
  Administered 2015-09-27 (×3): 50 ug via INTRAVENOUS

## 2015-09-27 MED ORDER — CLINDAMYCIN PHOSPHATE 600 MG/50ML IV SOLN
600.0000 mg | INTRAVENOUS | Status: AC
  Administered 2015-09-27: 600 mg via INTRAVENOUS
  Filled 2015-09-27: qty 50

## 2015-09-27 MED ORDER — OXYCODONE-ACETAMINOPHEN 5-325 MG PO TABS
ORAL_TABLET | ORAL | Status: AC
  Administered 2015-09-27: 16:00:00
  Filled 2015-09-27: qty 1

## 2015-09-27 MED ORDER — SUCCINYLCHOLINE CHLORIDE 20 MG/ML IJ SOLN
INTRAMUSCULAR | Status: DC | PRN
  Administered 2015-09-27: 120 mg via INTRAVENOUS

## 2015-09-27 MED ORDER — LIDOCAINE HCL (CARDIAC) 20 MG/ML IV SOLN
INTRAVENOUS | Status: DC | PRN
  Administered 2015-09-27: 80 mg via INTRAVENOUS

## 2015-09-27 MED ORDER — CALCIUM CARBONATE-VITAMIN D 500-200 MG-UNIT PO TABS
2.0000 | ORAL_TABLET | Freq: Two times a day (BID) | ORAL | Status: DC
  Administered 2015-09-27 (×2): 2 via ORAL
  Filled 2015-09-27: qty 1
  Filled 2015-09-27: qty 2

## 2015-09-27 MED ORDER — FENTANYL CITRATE (PF) 250 MCG/5ML IJ SOLN
INTRAMUSCULAR | Status: AC
  Filled 2015-09-27: qty 5

## 2015-09-27 MED ORDER — OXYCODONE-ACETAMINOPHEN 5-325 MG PO TABS
1.0000 | ORAL_TABLET | ORAL | Status: DC | PRN
  Administered 2015-09-27 – 2015-09-28 (×2): 1 via ORAL
  Filled 2015-09-27: qty 1

## 2015-09-27 MED ORDER — AMLODIPINE BESYLATE 5 MG PO TABS
5.0000 mg | ORAL_TABLET | Freq: Every day | ORAL | Status: DC
  Administered 2015-09-27: 5 mg via ORAL
  Filled 2015-09-27: qty 2

## 2015-09-27 MED ORDER — OXYCODONE-ACETAMINOPHEN 5-325 MG PO TABS: 1.0000 | ORAL_TABLET | ORAL | Status: AC | PRN

## 2015-09-27 MED ORDER — HYDROMORPHONE HCL 1 MG/ML IJ SOLN
INTRAMUSCULAR | Status: AC
  Administered 2015-09-27: 16:00:00
  Filled 2015-09-27: qty 1

## 2015-09-27 MED ORDER — KCL IN DEXTROSE-NACL 20-5-0.45 MEQ/L-%-% IV SOLN
INTRAVENOUS | Status: AC
  Filled 2015-09-27: qty 1000

## 2015-09-27 MED ORDER — LIDOCAINE-EPINEPHRINE 1 %-1:100000 IJ SOLN
INTRAMUSCULAR | Status: DC | PRN
  Administered 2015-09-27: 3 mL

## 2015-09-27 MED ORDER — PROMETHAZINE HCL 25 MG RE SUPP: 25.0000 mg | Freq: Four times a day (QID) | RECTAL | Status: DC | PRN

## 2015-09-27 MED ORDER — ANASTROZOLE 1 MG PO TABS
1.0000 mg | ORAL_TABLET | Freq: Every day | ORAL | Status: DC
  Administered 2015-09-27: 1 mg via ORAL
  Filled 2015-09-27: qty 1

## 2015-09-27 MED ORDER — ONDANSETRON HCL 4 MG/2ML IJ SOLN
INTRAMUSCULAR | Status: AC
  Filled 2015-09-27: qty 2

## 2015-09-27 MED ORDER — ROCURONIUM BROMIDE 50 MG/5ML IV SOLN
INTRAVENOUS | Status: AC
  Filled 2015-09-27: qty 1

## 2015-09-27 MED ORDER — CALCIUM CHLORIDE 10 % IV SOLN
INTRAVENOUS | Status: AC
  Filled 2015-09-27: qty 10

## 2015-09-27 MED ORDER — PROMETHAZINE HCL 25 MG/ML IJ SOLN
INTRAMUSCULAR | Status: AC
  Administered 2015-09-27: 16:00:00
  Filled 2015-09-27: qty 1

## 2015-09-27 MED ORDER — PHENYLEPHRINE HCL 10 MG/ML IJ SOLN
10.0000 mg | INTRAVENOUS | Status: DC | PRN
  Administered 2015-09-27: 20 ug/min via INTRAVENOUS

## 2015-09-27 MED ORDER — KCL IN DEXTROSE-NACL 20-5-0.45 MEQ/L-%-% IV SOLN
INTRAVENOUS | Status: DC
Start: 2015-09-27 — End: 2015-09-28
  Administered 2015-09-27: 75 mL/h via INTRAVENOUS
  Administered 2015-09-28: 01:00:00 via INTRAVENOUS
  Filled 2015-09-27: qty 1000

## 2015-09-27 MED ORDER — LIDOCAINE HCL (CARDIAC) 20 MG/ML IV SOLN
INTRAVENOUS | Status: AC
  Filled 2015-09-27: qty 5

## 2015-09-27 MED ORDER — ENSURE ENLIVE PO LIQD
237.0000 mL | Freq: Two times a day (BID) | ORAL | Status: DC
  Administered 2015-09-27: 237 mL via ORAL

## 2015-09-27 MED ORDER — LIDOCAINE-EPINEPHRINE 1 %-1:100000 IJ SOLN
INTRAMUSCULAR | Status: AC
  Filled 2015-09-27: qty 1

## 2015-09-27 MED ORDER — PROMETHAZINE HCL 25 MG PO TABS: 25.0000 mg | ORAL_TABLET | Freq: Four times a day (QID) | ORAL | Status: DC | PRN

## 2015-09-27 MED ORDER — PROPOFOL 10 MG/ML IV BOLUS
INTRAVENOUS | Status: DC | PRN
  Administered 2015-09-27: 30 mg via INTRAVENOUS
  Administered 2015-09-27: 170 mg via INTRAVENOUS

## 2015-09-27 MED ORDER — PHENYLEPHRINE HCL 10 MG/ML IJ SOLN
INTRAMUSCULAR | Status: DC | PRN
  Administered 2015-09-27 (×4): 80 ug via INTRAVENOUS
  Administered 2015-09-27: 40 ug via INTRAVENOUS

## 2015-09-27 MED ORDER — HYDROMORPHONE HCL 1 MG/ML IJ SOLN
0.2500 mg | INTRAMUSCULAR | Status: DC | PRN
  Administered 2015-09-27 (×2): 0.5 mg via INTRAVENOUS

## 2015-09-27 MED ORDER — ONDANSETRON HCL 4 MG/2ML IJ SOLN
INTRAMUSCULAR | Status: DC | PRN
  Administered 2015-09-27: 4 mg via INTRAVENOUS

## 2015-09-27 MED ORDER — PROMETHAZINE HCL 25 MG/ML IJ SOLN
6.2500 mg | INTRAMUSCULAR | Status: DC | PRN
  Administered 2015-09-27: 6.25 mg via INTRAVENOUS

## 2015-09-27 MED ORDER — MORPHINE SULFATE (PF) 2 MG/ML IV SOLN: 2.0000 mg | INTRAVENOUS | Status: DC | PRN

## 2015-09-27 MED ORDER — CLINDAMYCIN HCL 300 MG PO CAPS: 300.0000 mg | ORAL_CAPSULE | Freq: Three times a day (TID) | ORAL | Status: AC

## 2015-09-27 MED ORDER — 0.9 % SODIUM CHLORIDE (POUR BTL) OPTIME
TOPICAL | Status: DC | PRN
  Administered 2015-09-27: 1000 mL

## 2015-09-27 SURGICAL SUPPLY — 42 items
ADH SKN CLS APL DERMABOND .7 (GAUZE/BANDAGES/DRESSINGS) ×1
CANISTER SUCTION 2500CC (MISCELLANEOUS) ×3 IMPLANT
CLEANER TIP ELECTROSURG 2X2 (MISCELLANEOUS) IMPLANT
CLIP TI WIDE RED SMALL 24 (CLIP) ×2 IMPLANT
CONT SPEC 4OZ CLIKSEAL STRL BL (MISCELLANEOUS) IMPLANT
CORDS BIPOLAR (ELECTRODE) ×3 IMPLANT
COVER SURGICAL LIGHT HANDLE (MISCELLANEOUS) ×3 IMPLANT
CRADLE DONUT ADULT HEAD (MISCELLANEOUS) ×1 IMPLANT
DERMABOND ADVANCED (GAUZE/BANDAGES/DRESSINGS) ×2
DERMABOND ADVANCED .7 DNX12 (GAUZE/BANDAGES/DRESSINGS) ×1 IMPLANT
DRAIN CHANNEL 10F 3/8 F FF (DRAIN) ×3 IMPLANT
DRAPE PROXIMA HALF (DRAPES) ×3 IMPLANT
ELECT COATED BLADE 2.86 ST (ELECTRODE) ×3 IMPLANT
ELECT REM PT RETURN 9FT ADLT (ELECTROSURGICAL) ×3
ELECTRODE REM PT RTRN 9FT ADLT (ELECTROSURGICAL) ×1 IMPLANT
EVACUATOR SILICONE 100CC (DRAIN) ×3 IMPLANT
FORCEPS BIPOLAR SPETZLER 8 1.0 (NEUROSURGERY SUPPLIES) ×3 IMPLANT
GAUZE SPONGE 4X4 16PLY XRAY LF (GAUZE/BANDAGES/DRESSINGS) ×9 IMPLANT
GLOVE BIO SURGEON STRL SZ 6.5 (GLOVE) ×2 IMPLANT
GLOVE BIO SURGEONS STRL SZ 6.5 (GLOVE) ×1
GLOVE BIOGEL PI IND STRL 6.5 (GLOVE) ×1 IMPLANT
GLOVE BIOGEL PI INDICATOR 6.5 (GLOVE) ×2
GLOVE ECLIPSE 7.5 STRL STRAW (GLOVE) ×3 IMPLANT
GOWN STRL REUS W/ TWL LRG LVL3 (GOWN DISPOSABLE) ×3 IMPLANT
GOWN STRL REUS W/TWL LRG LVL3 (GOWN DISPOSABLE) ×9
HEMOSTAT SURGICEL 2X14 (HEMOSTASIS) IMPLANT
KIT BASIN OR (CUSTOM PROCEDURE TRAY) ×3 IMPLANT
KIT ROOM TURNOVER OR (KITS) ×3 IMPLANT
NDL HYPO 25GX1X1/2 BEV (NEEDLE) ×1 IMPLANT
NEEDLE HYPO 25GX1X1/2 BEV (NEEDLE) ×3 IMPLANT
NS IRRIG 1000ML POUR BTL (IV SOLUTION) ×3 IMPLANT
PAD ARMBOARD 7.5X6 YLW CONV (MISCELLANEOUS) ×5 IMPLANT
PENCIL BUTTON HOLSTER BLD 10FT (ELECTRODE) ×3 IMPLANT
PROBE NERVBE PRASS .33 (MISCELLANEOUS) ×3 IMPLANT
SHEARS HARMONIC 9CM CVD (BLADE) ×3 IMPLANT
SPONGE INTESTINAL PEANUT (DISPOSABLE) ×5 IMPLANT
SUT ETHILON 2 0 FS 18 (SUTURE) ×3 IMPLANT
SUT SILK 2 0 FS (SUTURE) ×3 IMPLANT
SUT SILK 3 0 REEL (SUTURE) ×3 IMPLANT
SUT VICRYL 4-0 PS2 18IN ABS (SUTURE) ×5 IMPLANT
TAPE CLOTH 4X10 WHT NS (GAUZE/BANDAGES/DRESSINGS) IMPLANT
TRAY ENT MC OR (CUSTOM PROCEDURE TRAY) ×3 IMPLANT

## 2015-09-27 NOTE — Transfer of Care (Signed)
Immediate Anesthesia Transfer of Care Note  Patient: Crystal Padilla  Procedure(s) Performed: Procedure(s): COMPLETION OF THYROIDECTOMY (N/A)  Patient Location: PACU  Anesthesia Type:General  Level of Consciousness: awake, alert  and patient cooperative  Airway & Oxygen Therapy: Patient Spontanous Breathing and Patient connected to nasal cannula oxygen  Post-op Assessment: Report given to RN and Post -op Vital signs reviewed and stable  Post vital signs: Reviewed and stable  Last Vitals:  Filed Vitals:   09/27/15 0751  BP: 144/86  Pulse: 88  Temp: 36.9 C  Resp: 20    Complications: No apparent anesthesia complications

## 2015-09-27 NOTE — Op Note (Signed)
DATE OF PROCEDURE:  09/27/2015                              OPERATIVE REPORT  SURGEON:  Leta Baptist, MD  ASSISTANT: Rometta Emery, PA-C  PREOPERATIVE DIAGNOSES: 1. Right thyroid mass  POSTOPERATIVE DIAGNOSES: 1. Right thyroid mass  PROCEDURE PERFORMED:  Completion thyroidectomy (CPT [phone])  ANESTHESIA:  General endotracheal tube anesthesia.  COMPLICATIONS:  None.  ESTIMATED BLOOD LOSS:  Less than 181ml  INDICATION FOR PROCEDURE:  Crystal Padilla is a 69 y.o. female with a history of multiple right thyroid nodules.  The largest nodule measured 2.1 cm in diameter. The patient subsequently underwent ultrasound guided fine needle aspiration biopsy of the largest right thyroid nodule. The result was consistent with a follicular lesion of undetermined significance. The patient had a left hemithyroidectomy 30+ years ago. Based on the above findings, the decision was made for the patient to undergo the completion thyroidectomy procedure. The risks, benefits, alternatives, and details of the procedure were discussed with the patient.  Questions were invited and answered.  Informed consent was obtained.  DESCRIPTION:  The patient was taken to the operating room and placed supine on the operating table.  General endotracheal tube anesthesia was administered by the anesthesiologist.  The patient was positioned and prepped and draped in a standard fashion for thyroidectomy surgery. Nerve monitoring endotracheal tube was used. The nerve monitoring system was functional throughout the case. Preop IV antibiotics was given.  1% lidocaine with 1-100,000 epinephrine was infiltrated at the planned site of incision. A standard lower neck transverse incision was made. The incision was carried down to the level of the platysma muscles. Superior and inferiorly based subplatysmal flaps were elevated in the standard fashion. The strap muscles were divided at midline and retracted laterally, exposing the right  thyroid lobe and isthmus. Multiple nodules were noted within the thyroid gland. Careful dissection was performed to free the thyroid gland from the surrounding soft tissue. The right recurrent laryngeal nerve and the external branch of the right superior laryngeal nerve were identified and preserved. The nerves were noted to be functional throughout the case. The thyroid specimen was sent to the pathology department for permanent histologic identification. The surgical site was copiously irrigated. A #10 JP drain was placed. The strap muscles were reapproximated. The skin incision was closed in layers with 4-0 Vicryl and Dermabond.  The care of the patient was turned over to the anesthesiologist.  The patient was awakened from anesthesia without difficulty.  She was extubated and transferred to the recovery room in good condition.  OPERATIVE FINDINGS:  Multiple right thyroid nodules.  SPECIMEN:  Completion thyroidectomy specimen.  FOLLOWUP CARE:  The patient will be observed overnight in the hospital. Her calcium level will be monitored. She will most likely be discharged home on postop day #1.  Ascencion Dike 09/27/2015 11:48 AM

## 2015-09-27 NOTE — Anesthesia Preprocedure Evaluation (Addendum)
Anesthesia Evaluation  Patient identified by MRN, date of birth, ID band Patient awake    Reviewed: Allergy & Precautions, NPO status , Patient's Chart, lab work & pertinent test results  History of Anesthesia Complications (+) Family history of anesthesia reactionNegative for: history of anesthetic complications  Airway Mallampati: I  TM Distance: >3 FB Neck ROM: Full  Mouth opening: Limited Mouth Opening  Dental  (+) Dental Advisory Given, Teeth Intact   Pulmonary neg COPD, Current Smoker,    breath sounds clear to auscultation       Cardiovascular hypertension,  Rhythm:Regular Rate:Normal     Neuro/Psych    GI/Hepatic GERD  Controlled,(+) Hepatitis -  Endo/Other  Elevated TSH  Renal/GU      Musculoskeletal  (+) Arthritis ,   Abdominal   Peds  Hematology   Anesthesia Other Findings   Reproductive/Obstetrics                            Anesthesia Physical Anesthesia Plan  ASA: III  Anesthesia Plan: General   Post-op Pain Management:    Induction: Intravenous  Airway Management Planned: Oral ETT  Additional Equipment:   Intra-op Plan:   Post-operative Plan: Extubation in OR  Informed Consent:   Dental advisory given  Plan Discussed with: CRNA and Anesthesiologist  Anesthesia Plan Comments:        Anesthesia Quick Evaluation

## 2015-09-27 NOTE — Anesthesia Procedure Notes (Signed)
Procedure Name: Intubation Date/Time: 09/27/2015 9:54 AM Performed by: Salli Quarry [place] Pelto Pre-anesthesia Checklist: Patient identified, Emergency Drugs available, Suction available and Patient being monitored Patient Re-evaluated:Patient Re-evaluated prior to inductionOxygen Delivery Method: Circle system utilized Preoxygenation: Pre-oxygenation with 100% oxygen Intubation Type: IV induction Ventilation: Mask ventilation without difficulty Laryngoscope Size: Mac and 3 Grade View: Grade I Tube type: Oral (NIM tube) Tube size: 7.0 mm Number of attempts: 1 Airway Equipment and Method: Stylet Placement Confirmation: ETT inserted through vocal cords under direct vision,  positive ETCO2 and breath sounds checked- equal and bilateral Secured at: 22 cm Tube secured with: Tape Dental Injury: Teeth and Oropharynx as per pre-operative assessment

## 2015-09-27 NOTE — H&P (Signed)
Cc: Right thyroid mass  HPI: The patient is a 69 year old female who returns today for her follow-up evaluation.  She was last seen 3 weeks ago.  At that time, she was noted to have multiple right thyroid nodules.  The largest nodule measured 2.1 cm in diameter. The patient subsequently underwent ultrasound guided fine needle aspiration biopsy of the largest right thyroid nodule. The result was consistent with a follicular lesion of undetermined significance.  The patient returns today reporting no new difficulty.  She denies any dysphagia, odynophagia or dysphonia.  She has no other complaint today. No other ENT, GI, or respiratory issue noted since the last visit.   Exam General: Communicates without difficulty, well nourished, no acute distress. Head: Normocephalic, no evidence injury, no tenderness, facial buttresses intact without stepoff. Eyes: PERRL, EOMI. No scleral icterus, conjunctivae clear. Neuro: CN II exam reveals vision grossly intact.  No nystagmus at any point of gaze. Ears: Auricles well formed without lesions.  Ear canals are intact without mass or lesion.  No erythema or edema is appreciated.  The TMs are intact without fluid. Nose: External evaluation reveals normal support and skin without lesions.  Dorsum is intact.  Anterior rhinoscopy reveals healthy pink mucosa over anterior aspect of inferior turbinates and intact septum.  No purulence noted. Oral:  Oral cavity and oropharynx are intact, symmetric, without erythema or edema.  Mucosa is moist without lesions. Neck: Full range of motion without pain.  There is no significant lymphadenopathy.  No masses palpable.  Thyroid bed slightly enlarged on the right.  Parotid glands and submandibular glands equal bilaterally without mass.  Trachea is midline. Neuro:  CN 2-12 grossly intact. Gait normal. Vestibular: No nystagmus at any point of gaze. The cerebellar examination is unremarkable.   Assessment 1.  The patient's right thyroid nodule  biopsy is consistent with a follicular lesion. This could represent either a follicular adenoma or follicular carcinoma.   2.  The patient is otherwise asymptomatic. She has no dysphagia, odynophagia or dysphonia.    Plan  1.  The physical exam findings and the FNA results are reviewed with the patient.  It is explained to the patient that the difference between follicular adenoma and follicular carcinoma cannot be determined from an aspiration specimen.  It will need to be determined on a gross specimen; specifically follicular carcinoma is defined by vascular invasion and extracapsular spread.  2.  Completion right thyroidectomy surgery is recommended.  3.  The patient would like to proceed with the procedure

## 2015-09-27 NOTE — Anesthesia Postprocedure Evaluation (Signed)
  Anesthesia Post-op Note  Patient: Crystal Padilla  Procedure(s) Performed: Procedure(s): COMPLETION OF THYROIDECTOMY (N/A)  Patient Location: PACU  Anesthesia Type:General  Level of Consciousness: awake  Airway and Oxygen Therapy: Patient Spontanous Breathing  Post-op Pain: mild  Post-op Assessment: Post-op Vital signs reviewed              Post-op Vital Signs: Reviewed  Last Vitals:  Filed Vitals:   09/27/15 1545  BP: 122/75  Pulse: 84  Temp: 36.8 C  Resp: 16    Complications: No apparent anesthesia complications

## 2015-09-27 NOTE — Discharge Instructions (Signed)
Thyroidectomy, Care After Refer to this sheet in the next few weeks. These instructions provide you with information about caring for yourself after your procedure. Your health care provider may also give you more specific instructions. Your treatment has been planned according to current medical practices, but problems sometimes occur. Call your health care provider if you have any problems or questions after your procedure. WHAT TO EXPECT AFTER THE PROCEDURE After your procedure, it is typical to have:  Mild pain in the neck or upper body, especially when swallowing.  A sore throat.  A weak voice. HOME CARE INSTRUCTIONS   Take medicines only as directed by your health care provider.  If your entire thyroid gland was removed, you may need to take thyroid hormone medicine from now on.  Do not take medicines that contain aspirin and ibuprofen until your health care provider says that you can. These medicines can increase your risk of bleeding.  Some pain medicines cause constipation. Drink enough fluid to keep your urine clear or pale yellow. This can help to prevent constipation.  Start slowly with eating. You may need to have only liquids and soft foods for a few days or as directed by your health care provider.  There are many different ways to close and cover an incision, including stitches (sutures), skin glue, and adhesive strips. Follow your health care provider's instructions for:  Incision care.  Bandage (dressing) changes and removal.  Incision closure removal.  Resume your usual activities as directed by your health care provider.  For the first 10 days after the procedure or as instructed by your health care provider:  Do not lift anything heavier than 20 lb (9.1 kg).  Do not jog, swim, or do other strenuous exercises.  Do not play contact sports.  Keep all follow-up visits as directed by your health care provider. This is important. SEEK MEDICAL CARE IF:  The  soreness in your throat gets worse.  You have increased pain at your incision or incisions.  You have increased bleeding from an incision.  Your incision becomes infected. Watch for:  Swelling.  Redness.  Warmth.  Pus.  You notice a bad smell coming from an incision or dressing.  You have a fever.  You feel lightheaded or faint.  You have numbness, tingling, or muscle spasms in your:  Arms.  Hands.  Feet.  Face.  You have trouble swallowing. SEEK IMMEDIATE MEDICAL CARE IF:   You develop a rash.  You have difficulty breathing.  You hear whistling noises coming from your chest.  You develop a cough that gets worse.  Your speech changes, or you have hoarseness that gets worse.   This information is not intended to replace advice given to you by your health care provider. Make sure you discuss any questions you have with your health care provider.   Document Released: 06/14/2005 Document Revised: 12/16/2014 Document Reviewed: 04/27/2014 Elsevier Interactive Patient Education [employer].

## 2015-09-28 ENCOUNTER — Encounter (HOSPITAL_COMMUNITY): Payer: Self-pay | Admitting: Otolaryngology

## 2015-09-28 DIAGNOSIS — E042 Nontoxic multinodular goiter: Secondary | ICD-10-CM | POA: Diagnosis not present

## 2015-09-28 LAB — CALCIUM: Calcium: 9.2 mg/dL (ref 8.9–10.3)

## 2015-09-28 MED ORDER — CALCIUM CARBONATE-VITAMIN D 500-200 MG-UNIT PO TABS: 2.0000 | ORAL_TABLET | Freq: Two times a day (BID) | ORAL | Status: AC

## 2015-09-28 MED ORDER — LEVOTHYROXINE SODIUM 75 MCG PO TABS: 75.0000 ug | ORAL_TABLET | Freq: Every day | ORAL | Status: AC

## 2015-09-28 NOTE — Progress Notes (Signed)
Pt discharged to home.  Discharge instructions explained to pt.  Pt has no questions at the time of discharge.  Pt states she has all belongings.  IV removed.  Pt ambulated off unit on her own.   

## 2015-09-28 NOTE — Discharge Summary (Signed)
Physician Discharge Summary  Patient ID: Crystal Padilla MRN: [id number] DOB/AGE: [birth year] 69 y.o.  Admit date: 09/27/2015 Discharge date: 09/28/2015  Admission Diagnoses: Right thyroid nodules  Discharge Diagnoses: Right thyroid nodules Active Problems:   S/P complete thyroidectomy   Discharged Condition: good  Hospital Course: Pt had an uneventful overnight stay. Pt tolerated po well. No bleeding. No stridor.  Consults: None  Significant Diagnostic Studies: None  Treatments: surgery: Completion thyroidectomy  Discharge Exam: Blood pressure 123/74, pulse 83, temperature 99 F (37.2 C), temperature source Oral, resp. rate 18, height 4' 11.5" (1.511 m), weight 60.782 kg (134 lb), SpO2 93 %. Incision/Wound:c/d/i  Disposition: 01-Home or Self Care  Discharge Instructions    Activity as tolerated - No restrictions    Complete by:  As directed      Diet general    Complete by:  As directed             Medication List    STOP taking these medications        acetaminophen 325 MG tablet  Commonly known as:  TYLENOL     ibuprofen 200 MG tablet  Commonly known as:  ADVIL,MOTRIN      TAKE these medications        alendronate 70 MG tablet  Commonly known as:  FOSAMAX  Take 70 mg by mouth every 7 (seven) days. Take with a full glass of water on an empty stomach.     amLODipine 5 MG tablet  Commonly known as:  NORVASC  Take 5 mg by mouth daily.     anastrozole 1 MG tablet  Commonly known as:  ARIMIDEX  Take 1 mg by mouth daily.     calcium-vitamin D 500-200 MG-UNIT tablet  Commonly known as:  OSCAL WITH D  Take 2 tablets by mouth 2 (two) times daily.     clindamycin 300 MG capsule  Commonly known as:  CLEOCIN  Take 1 capsule (300 mg total) by mouth 3 (three) times daily.     oxyCODONE-acetaminophen 5-325 MG tablet  Commonly known as:  ROXICET  Take 1 tablet by mouth every 4 (four) hours as needed for severe pain.           Follow-up Information    Follow up with Ascencion Dike, MD On 10/05/2015.   Specialty:  Otolaryngology   Why:  at 3:30pm   Contact information:   [address] [place] [address] [phone]       Signed: Ascencion Dike 09/28/2015, 7:33 AM

## 2015-10-05 ENCOUNTER — Ambulatory Visit (INDEPENDENT_AMBULATORY_CARE_PROVIDER_SITE_OTHER): Payer: Medicare Other | Admitting: Otolaryngology

## 2016-02-27 IMAGING — CR DG CHEST 2V
2 series · 2 of 2 positions shown · non-contrast
Comparison: None.

CLINICAL DATA: Preop thyroidectomy.  History of hypertension.

EXAM:
CHEST  2 VIEW

[w chest pa]
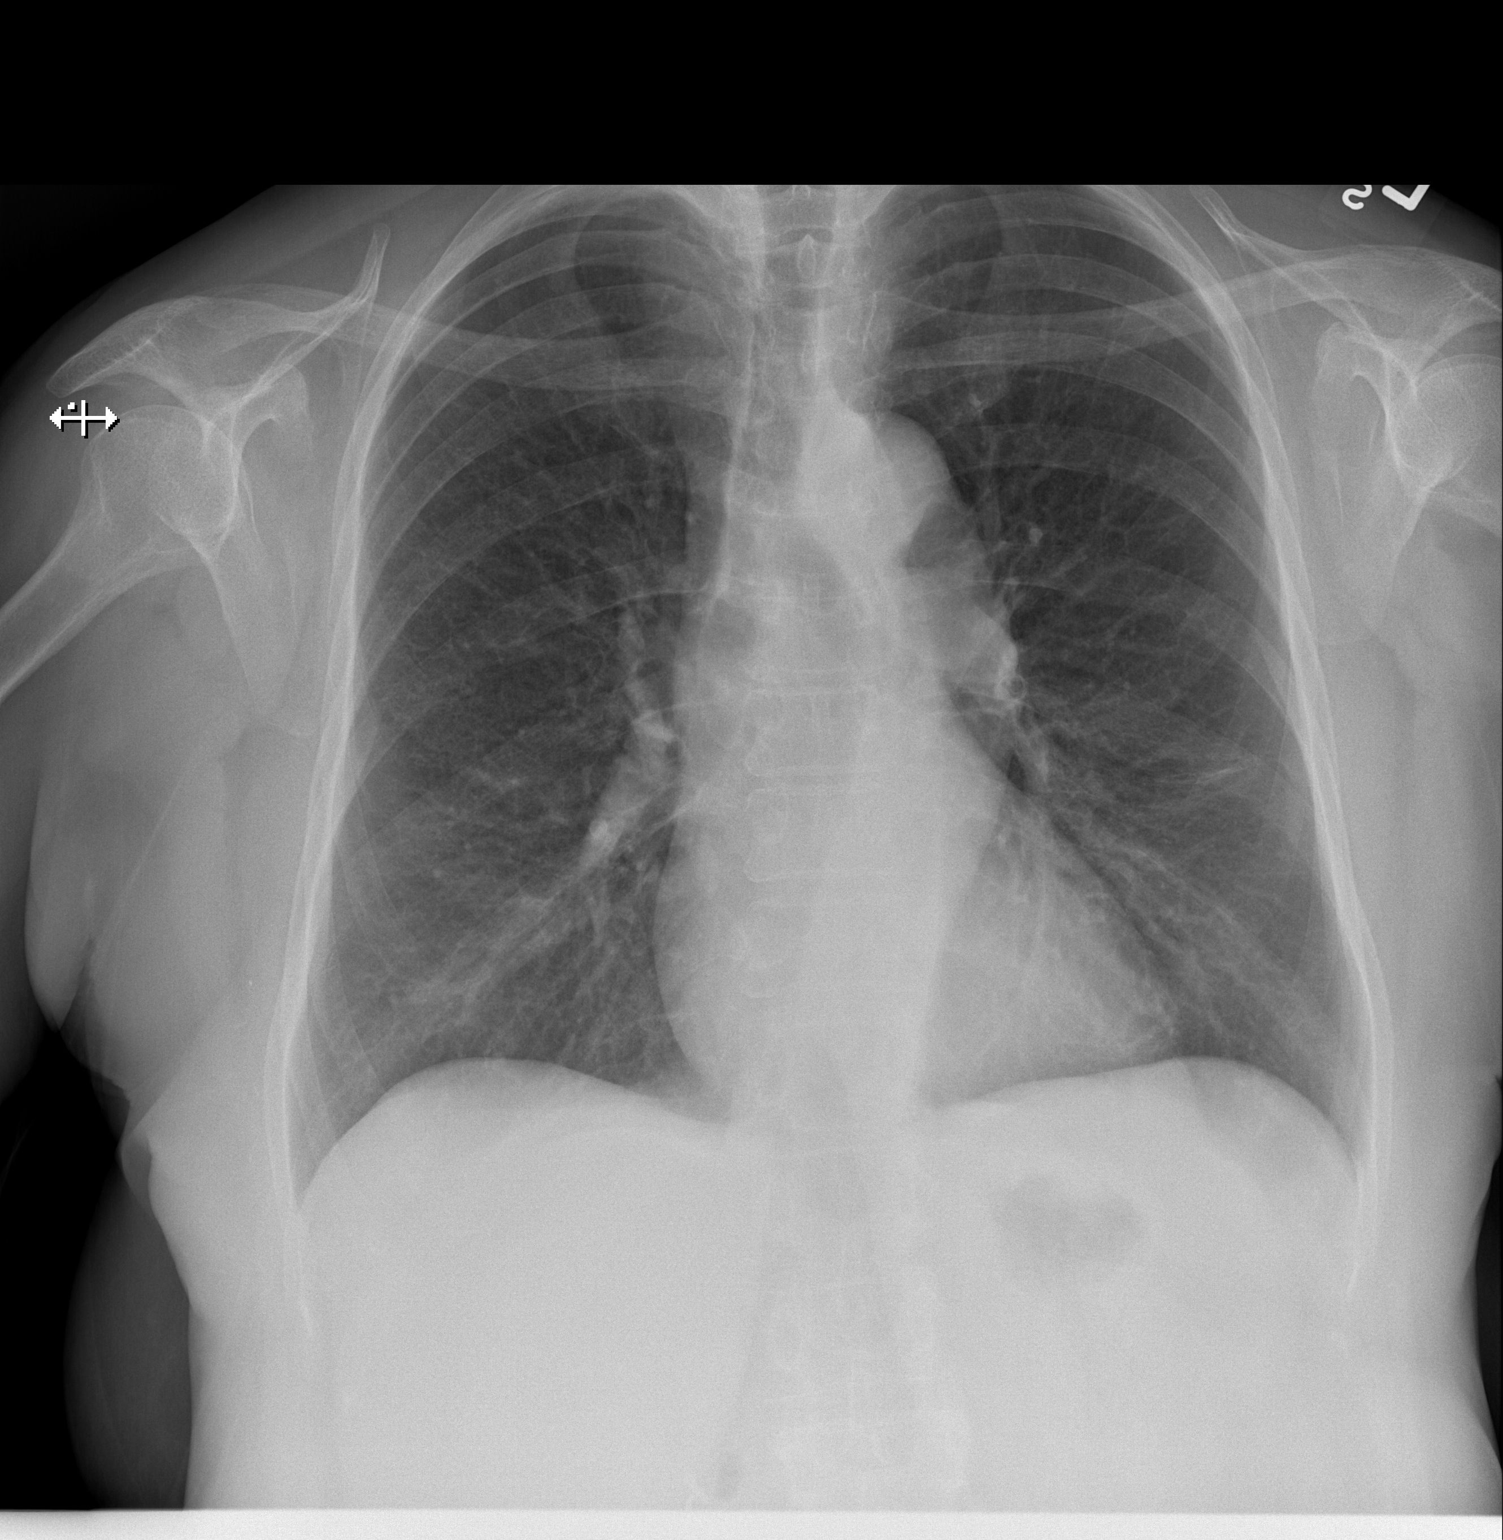

[w chest lat]
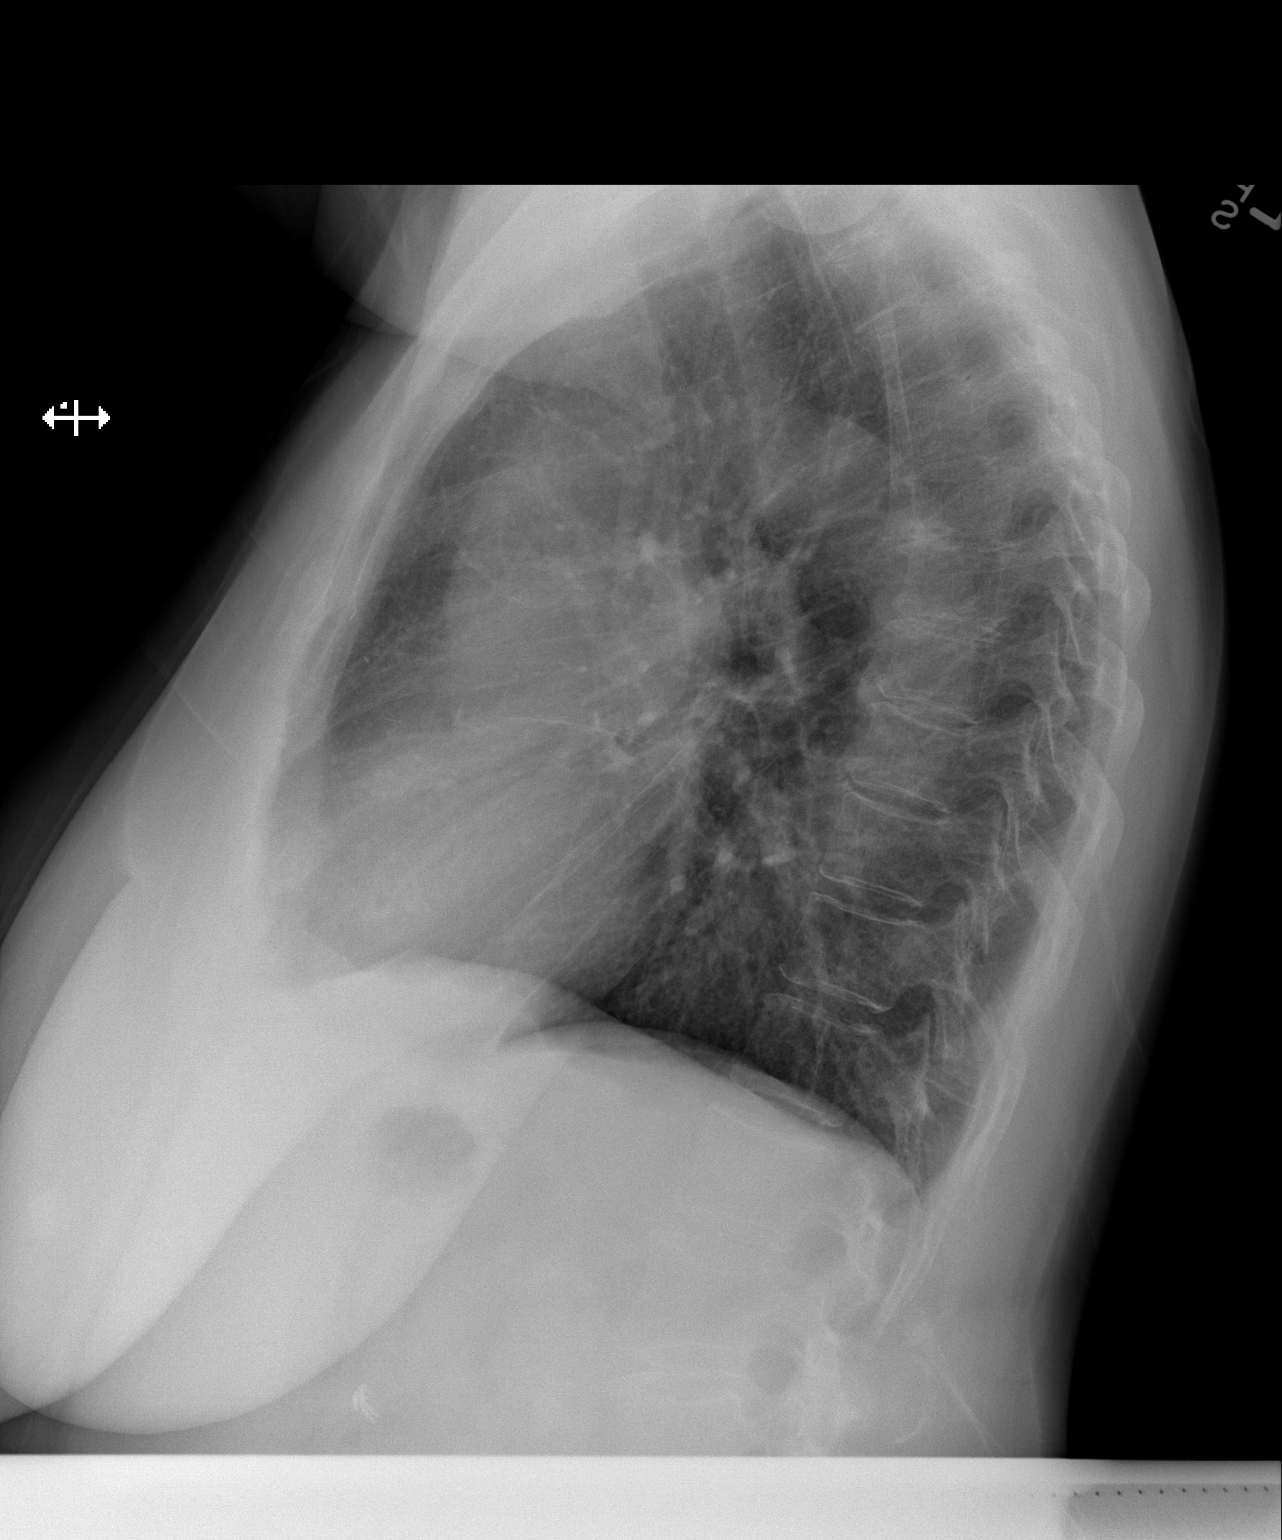

[2 of 2 positions shown; findings below may reference images not displayed]

FINDINGS: The heart size and mediastinal contours are within normal limits.
Both lungs are clear. The visualized skeletal structures are
unremarkable.
IMPRESSION: No active cardiopulmonary disease.

## 2016-04-04 ENCOUNTER — Ambulatory Visit (INDEPENDENT_AMBULATORY_CARE_PROVIDER_SITE_OTHER): Payer: Medicare Other | Admitting: Otolaryngology

## 2020-01-26 ENCOUNTER — Ambulatory Visit: Payer: Medicare Other | Attending: Internal Medicine

## 2020-01-26 ENCOUNTER — Other Ambulatory Visit: Payer: Self-pay

## 2020-01-26 DIAGNOSIS — Z23 Encounter for immunization: Secondary | ICD-10-CM | POA: Insufficient documentation

## 2020-01-26 NOTE — Progress Notes (Signed)
   Covid-19 Vaccination Clinic  Name:  MISTINA RODIN    MRN: [id number] DOB: 05/08/1946  01/26/2020  Ms. Walko was observed post Covid-19 immunization for 15 minutes without incidence. She was provided with Vaccine Information Sheet and instruction to access the V-Safe system.   Ms. Peeler was instructed to call 911 with any severe reactions post vaccine: Marland Kitchen Difficulty breathing  . Swelling of your face and throat  . A fast heartbeat  . A bad rash all over your body  . Dizziness and weakness    Immunizations Administered    Name Date Dose VIS Date Route   Moderna COVID-19 Vaccine 01/26/2020 11:52 AM 0.5 mL 11/09/2019 Intramuscular   Manufacturer: Moderna   Lot: [id number]   [id number]

## 2020-02-11 DIAGNOSIS — R5383 Other fatigue: Secondary | ICD-10-CM | POA: Diagnosis not present

## 2020-02-11 DIAGNOSIS — F17210 Nicotine dependence, cigarettes, uncomplicated: Secondary | ICD-10-CM | POA: Diagnosis not present

## 2020-02-11 DIAGNOSIS — Z7189 Other specified counseling: Secondary | ICD-10-CM | POA: Diagnosis not present

## 2020-02-11 DIAGNOSIS — I10 Essential (primary) hypertension: Secondary | ICD-10-CM | POA: Diagnosis not present

## 2020-02-11 DIAGNOSIS — Z1211 Encounter for screening for malignant neoplasm of colon: Secondary | ICD-10-CM | POA: Diagnosis not present

## 2020-02-11 DIAGNOSIS — Z0000 Encounter for general adult medical examination without abnormal findings: Secondary | ICD-10-CM | POA: Diagnosis not present

## 2020-02-11 DIAGNOSIS — Z79899 Other long term (current) drug therapy: Secondary | ICD-10-CM | POA: Diagnosis not present

## 2020-02-11 DIAGNOSIS — E039 Hypothyroidism, unspecified: Secondary | ICD-10-CM | POA: Diagnosis not present

## 2020-02-11 DIAGNOSIS — Z299 Encounter for prophylactic measures, unspecified: Secondary | ICD-10-CM | POA: Diagnosis not present

## 2020-02-11 DIAGNOSIS — E7800 Pure hypercholesterolemia, unspecified: Secondary | ICD-10-CM | POA: Diagnosis not present

## 2020-02-23 ENCOUNTER — Ambulatory Visit: Payer: Medicare Other | Attending: Internal Medicine

## 2020-02-23 DIAGNOSIS — Z23 Encounter for immunization: Secondary | ICD-10-CM

## 2020-02-23 NOTE — Progress Notes (Signed)
   Covid-19 Vaccination Clinic  Name:  Crystal Padilla    MRN: [id number] DOB: Oct 08, 1946  02/23/2020  [name] was observed post Covid-19 immunization for 15 minutes without incident. She was provided with Vaccine Information Sheet and instruction to access the V-Safe system.   [name] was instructed to call 911 with any severe reactions post vaccine: Marland Kitchen Difficulty breathing  . Swelling of face and throat  . A fast heartbeat  . A bad rash all over body  . Dizziness and weakness   Immunizations Administered    Name Date Dose VIS Date Route   Moderna COVID-19 Vaccine 02/23/2020 11:43 AM 0.5 mL 11/09/2019 Intramuscular   Manufacturer: Moderna   Lot: [id number]   [id number]

## 2020-06-07 DIAGNOSIS — E039 Hypothyroidism, unspecified: Secondary | ICD-10-CM | POA: Diagnosis not present

## 2020-06-07 DIAGNOSIS — I10 Essential (primary) hypertension: Secondary | ICD-10-CM | POA: Diagnosis not present

## 2020-06-15 DIAGNOSIS — E039 Hypothyroidism, unspecified: Secondary | ICD-10-CM | POA: Diagnosis not present

## 2020-06-15 DIAGNOSIS — I10 Essential (primary) hypertension: Secondary | ICD-10-CM | POA: Diagnosis not present

## 2020-06-15 DIAGNOSIS — Z299 Encounter for prophylactic measures, unspecified: Secondary | ICD-10-CM | POA: Diagnosis not present

## 2020-06-15 DIAGNOSIS — Z1231 Encounter for screening mammogram for malignant neoplasm of breast: Secondary | ICD-10-CM | POA: Diagnosis not present

## 2020-06-15 DIAGNOSIS — C50919 Malignant neoplasm of unspecified site of unspecified female breast: Secondary | ICD-10-CM | POA: Diagnosis not present

## 2020-07-05 DIAGNOSIS — Z853 Personal history of malignant neoplasm of breast: Secondary | ICD-10-CM | POA: Diagnosis not present

## 2020-07-05 DIAGNOSIS — R922 Inconclusive mammogram: Secondary | ICD-10-CM | POA: Diagnosis not present

## 2020-09-07 DIAGNOSIS — E039 Hypothyroidism, unspecified: Secondary | ICD-10-CM | POA: Diagnosis not present

## 2020-09-07 DIAGNOSIS — I10 Essential (primary) hypertension: Secondary | ICD-10-CM | POA: Diagnosis not present

## 2020-10-18 DIAGNOSIS — Z23 Encounter for immunization: Secondary | ICD-10-CM | POA: Diagnosis not present

## 2020-10-18 DIAGNOSIS — I10 Essential (primary) hypertension: Secondary | ICD-10-CM | POA: Diagnosis not present

## 2020-10-18 DIAGNOSIS — E039 Hypothyroidism, unspecified: Secondary | ICD-10-CM | POA: Diagnosis not present

## 2020-10-18 DIAGNOSIS — Z299 Encounter for prophylactic measures, unspecified: Secondary | ICD-10-CM | POA: Diagnosis not present

## 2020-10-18 DIAGNOSIS — C50919 Malignant neoplasm of unspecified site of unspecified female breast: Secondary | ICD-10-CM | POA: Diagnosis not present

## 2020-11-07 DIAGNOSIS — E039 Hypothyroidism, unspecified: Secondary | ICD-10-CM | POA: Diagnosis not present

## 2020-11-07 DIAGNOSIS — I10 Essential (primary) hypertension: Secondary | ICD-10-CM | POA: Diagnosis not present

## 2021-01-24 DIAGNOSIS — I69354 Hemiplegia and hemiparesis following cerebral infarction affecting left non-dominant side: Secondary | ICD-10-CM | POA: Diagnosis not present

## 2021-01-24 DIAGNOSIS — Z299 Encounter for prophylactic measures, unspecified: Secondary | ICD-10-CM | POA: Diagnosis not present

## 2021-01-24 DIAGNOSIS — I10 Essential (primary) hypertension: Secondary | ICD-10-CM | POA: Diagnosis not present

## 2021-01-24 DIAGNOSIS — E039 Hypothyroidism, unspecified: Secondary | ICD-10-CM | POA: Diagnosis not present

## 2021-01-24 DIAGNOSIS — C50919 Malignant neoplasm of unspecified site of unspecified female breast: Secondary | ICD-10-CM | POA: Diagnosis not present

## 2021-01-24 DIAGNOSIS — G8194 Hemiplegia, unspecified affecting left nondominant side: Secondary | ICD-10-CM | POA: Diagnosis not present

## 2021-02-05 DIAGNOSIS — I10 Essential (primary) hypertension: Secondary | ICD-10-CM | POA: Diagnosis not present

## 2021-02-05 DIAGNOSIS — E039 Hypothyroidism, unspecified: Secondary | ICD-10-CM | POA: Diagnosis not present

## 2021-04-07 DIAGNOSIS — I10 Essential (primary) hypertension: Secondary | ICD-10-CM | POA: Diagnosis not present

## 2021-04-07 DIAGNOSIS — E039 Hypothyroidism, unspecified: Secondary | ICD-10-CM | POA: Diagnosis not present

## 2021-05-23 DIAGNOSIS — H40033 Anatomical narrow angle, bilateral: Secondary | ICD-10-CM | POA: Diagnosis not present

## 2021-05-23 DIAGNOSIS — H2513 Age-related nuclear cataract, bilateral: Secondary | ICD-10-CM | POA: Diagnosis not present

## 2021-06-07 DIAGNOSIS — I10 Essential (primary) hypertension: Secondary | ICD-10-CM | POA: Diagnosis not present

## 2021-06-07 DIAGNOSIS — E039 Hypothyroidism, unspecified: Secondary | ICD-10-CM | POA: Diagnosis not present

## 2021-06-26 DIAGNOSIS — Z7189 Other specified counseling: Secondary | ICD-10-CM | POA: Diagnosis not present

## 2021-06-26 DIAGNOSIS — Z299 Encounter for prophylactic measures, unspecified: Secondary | ICD-10-CM | POA: Diagnosis not present

## 2021-06-26 DIAGNOSIS — Z1339 Encounter for screening examination for other mental health and behavioral disorders: Secondary | ICD-10-CM | POA: Diagnosis not present

## 2021-06-26 DIAGNOSIS — I10 Essential (primary) hypertension: Secondary | ICD-10-CM | POA: Diagnosis not present

## 2021-06-26 DIAGNOSIS — Z1331 Encounter for screening for depression: Secondary | ICD-10-CM | POA: Diagnosis not present

## 2021-06-26 DIAGNOSIS — Z0000 Encounter for general adult medical examination without abnormal findings: Secondary | ICD-10-CM | POA: Diagnosis not present

## 2021-06-26 DIAGNOSIS — Z6826 Body mass index (BMI) 26.0-26.9, adult: Secondary | ICD-10-CM | POA: Diagnosis not present

## 2021-06-26 DIAGNOSIS — D692 Other nonthrombocytopenic purpura: Secondary | ICD-10-CM | POA: Diagnosis not present

## 2021-06-29 DIAGNOSIS — Z79899 Other long term (current) drug therapy: Secondary | ICD-10-CM | POA: Diagnosis not present

## 2021-06-29 DIAGNOSIS — R5383 Other fatigue: Secondary | ICD-10-CM | POA: Diagnosis not present

## 2021-06-29 DIAGNOSIS — E7800 Pure hypercholesterolemia, unspecified: Secondary | ICD-10-CM | POA: Diagnosis not present

## 2021-06-29 DIAGNOSIS — E039 Hypothyroidism, unspecified: Secondary | ICD-10-CM | POA: Diagnosis not present

## 2021-08-08 DIAGNOSIS — I10 Essential (primary) hypertension: Secondary | ICD-10-CM | POA: Diagnosis not present

## 2021-08-08 DIAGNOSIS — E039 Hypothyroidism, unspecified: Secondary | ICD-10-CM | POA: Diagnosis not present

## 2021-10-02 DIAGNOSIS — G8194 Hemiplegia, unspecified affecting left nondominant side: Secondary | ICD-10-CM | POA: Diagnosis not present

## 2021-10-02 DIAGNOSIS — Z23 Encounter for immunization: Secondary | ICD-10-CM | POA: Diagnosis not present

## 2021-10-02 DIAGNOSIS — I739 Peripheral vascular disease, unspecified: Secondary | ICD-10-CM | POA: Diagnosis not present

## 2021-10-02 DIAGNOSIS — F17210 Nicotine dependence, cigarettes, uncomplicated: Secondary | ICD-10-CM | POA: Diagnosis not present

## 2021-10-02 DIAGNOSIS — Z299 Encounter for prophylactic measures, unspecified: Secondary | ICD-10-CM | POA: Diagnosis not present

## 2021-10-02 DIAGNOSIS — I10 Essential (primary) hypertension: Secondary | ICD-10-CM | POA: Diagnosis not present

## 2021-10-08 DIAGNOSIS — E039 Hypothyroidism, unspecified: Secondary | ICD-10-CM | POA: Diagnosis not present

## 2021-10-08 DIAGNOSIS — I10 Essential (primary) hypertension: Secondary | ICD-10-CM | POA: Diagnosis not present

## 2021-11-07 DIAGNOSIS — E039 Hypothyroidism, unspecified: Secondary | ICD-10-CM | POA: Diagnosis not present

## 2021-11-07 DIAGNOSIS — I10 Essential (primary) hypertension: Secondary | ICD-10-CM | POA: Diagnosis not present

## 2021-11-07 DIAGNOSIS — E049 Nontoxic goiter, unspecified: Secondary | ICD-10-CM | POA: Diagnosis not present

## 2021-11-07 DIAGNOSIS — E871 Hypo-osmolality and hyponatremia: Secondary | ICD-10-CM | POA: Diagnosis not present

## 2021-12-07 DIAGNOSIS — E871 Hypo-osmolality and hyponatremia: Secondary | ICD-10-CM | POA: Diagnosis not present

## 2021-12-07 DIAGNOSIS — E039 Hypothyroidism, unspecified: Secondary | ICD-10-CM | POA: Diagnosis not present

## 2021-12-07 DIAGNOSIS — E049 Nontoxic goiter, unspecified: Secondary | ICD-10-CM | POA: Diagnosis not present

## 2022-01-08 DIAGNOSIS — E049 Nontoxic goiter, unspecified: Secondary | ICD-10-CM | POA: Diagnosis not present

## 2022-01-08 DIAGNOSIS — E039 Hypothyroidism, unspecified: Secondary | ICD-10-CM | POA: Diagnosis not present

## 2022-01-08 DIAGNOSIS — E871 Hypo-osmolality and hyponatremia: Secondary | ICD-10-CM | POA: Diagnosis not present

## 2022-01-22 DIAGNOSIS — Z299 Encounter for prophylactic measures, unspecified: Secondary | ICD-10-CM | POA: Diagnosis not present

## 2022-01-22 DIAGNOSIS — I739 Peripheral vascular disease, unspecified: Secondary | ICD-10-CM | POA: Diagnosis not present

## 2022-01-22 DIAGNOSIS — I10 Essential (primary) hypertension: Secondary | ICD-10-CM | POA: Diagnosis not present

## 2022-01-22 DIAGNOSIS — C50919 Malignant neoplasm of unspecified site of unspecified female breast: Secondary | ICD-10-CM | POA: Diagnosis not present

## 2022-01-22 DIAGNOSIS — D692 Other nonthrombocytopenic purpura: Secondary | ICD-10-CM | POA: Diagnosis not present

## 2022-05-26 DIAGNOSIS — M79672 Pain in left foot: Secondary | ICD-10-CM | POA: Diagnosis not present

## 2022-05-26 DIAGNOSIS — M958 Other specified acquired deformities of musculoskeletal system: Secondary | ICD-10-CM | POA: Diagnosis not present

## 2022-05-26 DIAGNOSIS — M25572 Pain in left ankle and joints of left foot: Secondary | ICD-10-CM | POA: Diagnosis not present

## 2022-05-26 DIAGNOSIS — S93402A Sprain of unspecified ligament of left ankle, initial encounter: Secondary | ICD-10-CM | POA: Diagnosis not present

## 2022-05-26 DIAGNOSIS — Z743 Need for continuous supervision: Secondary | ICD-10-CM | POA: Diagnosis not present

## 2022-05-26 DIAGNOSIS — R609 Edema, unspecified: Secondary | ICD-10-CM | POA: Diagnosis not present

## 2022-05-26 DIAGNOSIS — M19072 Primary osteoarthritis, left ankle and foot: Secondary | ICD-10-CM | POA: Diagnosis not present

## 2022-05-26 DIAGNOSIS — M79673 Pain in unspecified foot: Secondary | ICD-10-CM | POA: Diagnosis not present

## 2022-05-26 DIAGNOSIS — X501XXA Overexertion from prolonged static or awkward postures, initial encounter: Secondary | ICD-10-CM | POA: Diagnosis not present

## 2022-05-26 DIAGNOSIS — S92155A Nondisplaced avulsion fracture (chip fracture) of left talus, initial encounter for closed fracture: Secondary | ICD-10-CM | POA: Diagnosis not present

## 2022-05-26 DIAGNOSIS — M85872 Other specified disorders of bone density and structure, left ankle and foot: Secondary | ICD-10-CM | POA: Diagnosis not present

## 2022-05-27 DIAGNOSIS — K5900 Constipation, unspecified: Secondary | ICD-10-CM | POA: Diagnosis not present

## 2022-05-27 DIAGNOSIS — I10 Essential (primary) hypertension: Secondary | ICD-10-CM | POA: Diagnosis not present

## 2022-05-27 DIAGNOSIS — S93402A Sprain of unspecified ligament of left ankle, initial encounter: Secondary | ICD-10-CM | POA: Diagnosis not present

## 2022-05-30 ENCOUNTER — Other Ambulatory Visit: Payer: Self-pay | Admitting: Internal Medicine

## 2022-05-30 DIAGNOSIS — Z1231 Encounter for screening mammogram for malignant neoplasm of breast: Secondary | ICD-10-CM

## 2022-07-31 DIAGNOSIS — Z6826 Body mass index (BMI) 26.0-26.9, adult: Secondary | ICD-10-CM | POA: Diagnosis not present

## 2022-07-31 DIAGNOSIS — E039 Hypothyroidism, unspecified: Secondary | ICD-10-CM | POA: Diagnosis not present

## 2022-07-31 DIAGNOSIS — Z1339 Encounter for screening examination for other mental health and behavioral disorders: Secondary | ICD-10-CM | POA: Diagnosis not present

## 2022-07-31 DIAGNOSIS — R5383 Other fatigue: Secondary | ICD-10-CM | POA: Diagnosis not present

## 2022-07-31 DIAGNOSIS — Z79899 Other long term (current) drug therapy: Secondary | ICD-10-CM | POA: Diagnosis not present

## 2022-07-31 DIAGNOSIS — Z7189 Other specified counseling: Secondary | ICD-10-CM | POA: Diagnosis not present

## 2022-07-31 DIAGNOSIS — E7800 Pure hypercholesterolemia, unspecified: Secondary | ICD-10-CM | POA: Diagnosis not present

## 2022-07-31 DIAGNOSIS — I10 Essential (primary) hypertension: Secondary | ICD-10-CM | POA: Diagnosis not present

## 2022-07-31 DIAGNOSIS — Z1331 Encounter for screening for depression: Secondary | ICD-10-CM | POA: Diagnosis not present

## 2022-07-31 DIAGNOSIS — Z0000 Encounter for general adult medical examination without abnormal findings: Secondary | ICD-10-CM | POA: Diagnosis not present

## 2022-07-31 DIAGNOSIS — Z299 Encounter for prophylactic measures, unspecified: Secondary | ICD-10-CM | POA: Diagnosis not present

## 2022-08-08 DIAGNOSIS — I10 Essential (primary) hypertension: Secondary | ICD-10-CM | POA: Diagnosis not present

## 2022-08-08 DIAGNOSIS — E039 Hypothyroidism, unspecified: Secondary | ICD-10-CM | POA: Diagnosis not present

## 2022-11-08 DIAGNOSIS — E039 Hypothyroidism, unspecified: Secondary | ICD-10-CM | POA: Diagnosis not present

## 2022-11-08 DIAGNOSIS — Z6826 Body mass index (BMI) 26.0-26.9, adult: Secondary | ICD-10-CM | POA: Diagnosis not present

## 2022-11-08 DIAGNOSIS — F17210 Nicotine dependence, cigarettes, uncomplicated: Secondary | ICD-10-CM | POA: Diagnosis not present

## 2022-11-08 DIAGNOSIS — Z299 Encounter for prophylactic measures, unspecified: Secondary | ICD-10-CM | POA: Diagnosis not present

## 2022-11-08 DIAGNOSIS — Z0000 Encounter for general adult medical examination without abnormal findings: Secondary | ICD-10-CM | POA: Diagnosis not present

## 2022-11-08 DIAGNOSIS — I10 Essential (primary) hypertension: Secondary | ICD-10-CM | POA: Diagnosis not present

## 2022-11-08 DIAGNOSIS — E7800 Pure hypercholesterolemia, unspecified: Secondary | ICD-10-CM | POA: Diagnosis not present

## 2022-12-05 DIAGNOSIS — Z1231 Encounter for screening mammogram for malignant neoplasm of breast: Secondary | ICD-10-CM | POA: Diagnosis not present

## 2023-04-18 DIAGNOSIS — Z7189 Other specified counseling: Secondary | ICD-10-CM | POA: Diagnosis not present

## 2023-04-18 DIAGNOSIS — I10 Essential (primary) hypertension: Secondary | ICD-10-CM | POA: Diagnosis not present

## 2023-04-18 DIAGNOSIS — Z299 Encounter for prophylactic measures, unspecified: Secondary | ICD-10-CM | POA: Diagnosis not present

## 2023-04-18 DIAGNOSIS — C50919 Malignant neoplasm of unspecified site of unspecified female breast: Secondary | ICD-10-CM | POA: Diagnosis not present

## 2023-04-18 DIAGNOSIS — I739 Peripheral vascular disease, unspecified: Secondary | ICD-10-CM | POA: Diagnosis not present

## 2023-04-18 DIAGNOSIS — Z1331 Encounter for screening for depression: Secondary | ICD-10-CM | POA: Diagnosis not present

## 2023-04-18 DIAGNOSIS — Z0000 Encounter for general adult medical examination without abnormal findings: Secondary | ICD-10-CM | POA: Diagnosis not present

## 2023-04-18 DIAGNOSIS — F17210 Nicotine dependence, cigarettes, uncomplicated: Secondary | ICD-10-CM | POA: Diagnosis not present

## 2023-04-18 DIAGNOSIS — Z1339 Encounter for screening examination for other mental health and behavioral disorders: Secondary | ICD-10-CM | POA: Diagnosis not present

## 2023-07-29 DIAGNOSIS — E039 Hypothyroidism, unspecified: Secondary | ICD-10-CM | POA: Diagnosis not present

## 2023-07-29 DIAGNOSIS — Z299 Encounter for prophylactic measures, unspecified: Secondary | ICD-10-CM | POA: Diagnosis not present

## 2023-07-29 DIAGNOSIS — F17210 Nicotine dependence, cigarettes, uncomplicated: Secondary | ICD-10-CM | POA: Diagnosis not present

## 2023-07-29 DIAGNOSIS — I10 Essential (primary) hypertension: Secondary | ICD-10-CM | POA: Diagnosis not present

## 2023-07-29 DIAGNOSIS — Z1211 Encounter for screening for malignant neoplasm of colon: Secondary | ICD-10-CM | POA: Diagnosis not present

## 2023-07-29 DIAGNOSIS — Z79899 Other long term (current) drug therapy: Secondary | ICD-10-CM | POA: Diagnosis not present

## 2023-07-29 DIAGNOSIS — E7800 Pure hypercholesterolemia, unspecified: Secondary | ICD-10-CM | POA: Diagnosis not present

## 2023-07-29 DIAGNOSIS — R5383 Other fatigue: Secondary | ICD-10-CM | POA: Diagnosis not present

## 2023-07-29 DIAGNOSIS — Z0000 Encounter for general adult medical examination without abnormal findings: Secondary | ICD-10-CM | POA: Diagnosis not present

## 2023-07-29 DIAGNOSIS — Z125 Encounter for screening for malignant neoplasm of prostate: Secondary | ICD-10-CM | POA: Diagnosis not present

## 2023-07-31 DIAGNOSIS — R2681 Unsteadiness on feet: Secondary | ICD-10-CM | POA: Diagnosis not present

## 2023-07-31 DIAGNOSIS — I69354 Hemiplegia and hemiparesis following cerebral infarction affecting left non-dominant side: Secondary | ICD-10-CM | POA: Diagnosis not present

## 2023-07-31 DIAGNOSIS — F17210 Nicotine dependence, cigarettes, uncomplicated: Secondary | ICD-10-CM | POA: Diagnosis not present

## 2023-07-31 DIAGNOSIS — M810 Age-related osteoporosis without current pathological fracture: Secondary | ICD-10-CM | POA: Diagnosis not present

## 2023-07-31 DIAGNOSIS — I10 Essential (primary) hypertension: Secondary | ICD-10-CM | POA: Diagnosis not present

## 2023-07-31 DIAGNOSIS — E663 Overweight: Secondary | ICD-10-CM | POA: Diagnosis not present

## 2023-07-31 DIAGNOSIS — E890 Postprocedural hypothyroidism: Secondary | ICD-10-CM | POA: Diagnosis not present

## 2023-07-31 DIAGNOSIS — Z7901 Long term (current) use of anticoagulants: Secondary | ICD-10-CM | POA: Diagnosis not present

## 2023-07-31 DIAGNOSIS — C50911 Malignant neoplasm of unspecified site of right female breast: Secondary | ICD-10-CM | POA: Diagnosis not present

## 2023-07-31 DIAGNOSIS — Z6825 Body mass index (BMI) 25.0-25.9, adult: Secondary | ICD-10-CM | POA: Diagnosis not present

## 2023-07-31 DIAGNOSIS — J42 Unspecified chronic bronchitis: Secondary | ICD-10-CM | POA: Diagnosis not present

## 2023-07-31 DIAGNOSIS — Z008 Encounter for other general examination: Secondary | ICD-10-CM | POA: Diagnosis not present

## 2023-08-07 DIAGNOSIS — E871 Hypo-osmolality and hyponatremia: Secondary | ICD-10-CM | POA: Diagnosis not present

## 2023-10-22 DIAGNOSIS — M25552 Pain in left hip: Secondary | ICD-10-CM | POA: Diagnosis not present

## 2023-10-22 DIAGNOSIS — G8194 Hemiplegia, unspecified affecting left nondominant side: Secondary | ICD-10-CM | POA: Diagnosis not present

## 2023-10-22 DIAGNOSIS — F17210 Nicotine dependence, cigarettes, uncomplicated: Secondary | ICD-10-CM | POA: Diagnosis not present

## 2023-11-07 DIAGNOSIS — F17210 Nicotine dependence, cigarettes, uncomplicated: Secondary | ICD-10-CM | POA: Diagnosis not present

## 2023-11-07 DIAGNOSIS — I82412 Acute embolism and thrombosis of left femoral vein: Secondary | ICD-10-CM | POA: Diagnosis not present

## 2023-11-07 DIAGNOSIS — M25552 Pain in left hip: Secondary | ICD-10-CM | POA: Diagnosis not present

## 2023-11-07 DIAGNOSIS — M79605 Pain in left leg: Secondary | ICD-10-CM | POA: Diagnosis not present

## 2023-11-07 DIAGNOSIS — I82409 Acute embolism and thrombosis of unspecified deep veins of unspecified lower extremity: Secondary | ICD-10-CM | POA: Diagnosis not present

## 2023-11-07 DIAGNOSIS — I82402 Acute embolism and thrombosis of unspecified deep veins of left lower extremity: Secondary | ICD-10-CM | POA: Diagnosis not present

## 2023-11-07 DIAGNOSIS — M160 Bilateral primary osteoarthritis of hip: Secondary | ICD-10-CM | POA: Diagnosis not present

## 2023-11-07 DIAGNOSIS — Z6826 Body mass index (BMI) 26.0-26.9, adult: Secondary | ICD-10-CM | POA: Diagnosis not present

## 2023-11-07 DIAGNOSIS — Z7901 Long term (current) use of anticoagulants: Secondary | ICD-10-CM | POA: Diagnosis not present

## 2023-11-11 DIAGNOSIS — D692 Other nonthrombocytopenic purpura: Secondary | ICD-10-CM | POA: Diagnosis not present

## 2023-11-11 DIAGNOSIS — I10 Essential (primary) hypertension: Secondary | ICD-10-CM | POA: Diagnosis not present

## 2023-11-11 DIAGNOSIS — Z299 Encounter for prophylactic measures, unspecified: Secondary | ICD-10-CM | POA: Diagnosis not present

## 2023-11-11 DIAGNOSIS — R532 Functional quadriplegia: Secondary | ICD-10-CM | POA: Diagnosis not present

## 2023-11-11 DIAGNOSIS — I82409 Acute embolism and thrombosis of unspecified deep veins of unspecified lower extremity: Secondary | ICD-10-CM | POA: Diagnosis not present

## 2023-12-09 DIAGNOSIS — G8194 Hemiplegia, unspecified affecting left nondominant side: Secondary | ICD-10-CM | POA: Diagnosis not present

## 2023-12-09 DIAGNOSIS — Z299 Encounter for prophylactic measures, unspecified: Secondary | ICD-10-CM | POA: Diagnosis not present

## 2023-12-09 DIAGNOSIS — I82409 Acute embolism and thrombosis of unspecified deep veins of unspecified lower extremity: Secondary | ICD-10-CM | POA: Diagnosis not present

## 2023-12-09 DIAGNOSIS — I10 Essential (primary) hypertension: Secondary | ICD-10-CM | POA: Diagnosis not present

## 2023-12-09 DIAGNOSIS — R532 Functional quadriplegia: Secondary | ICD-10-CM | POA: Diagnosis not present

## 2024-01-06 DIAGNOSIS — I10 Essential (primary) hypertension: Secondary | ICD-10-CM | POA: Diagnosis not present

## 2024-01-06 DIAGNOSIS — R532 Functional quadriplegia: Secondary | ICD-10-CM | POA: Diagnosis not present

## 2024-01-06 DIAGNOSIS — Z299 Encounter for prophylactic measures, unspecified: Secondary | ICD-10-CM | POA: Diagnosis not present

## 2024-01-06 DIAGNOSIS — G8194 Hemiplegia, unspecified affecting left nondominant side: Secondary | ICD-10-CM | POA: Diagnosis not present

## 2024-01-06 DIAGNOSIS — I82409 Acute embolism and thrombosis of unspecified deep veins of unspecified lower extremity: Secondary | ICD-10-CM | POA: Diagnosis not present

## 2024-01-06 DIAGNOSIS — D692 Other nonthrombocytopenic purpura: Secondary | ICD-10-CM | POA: Diagnosis not present

## 2024-02-03 DIAGNOSIS — Z299 Encounter for prophylactic measures, unspecified: Secondary | ICD-10-CM | POA: Diagnosis not present

## 2024-02-03 DIAGNOSIS — C50919 Malignant neoplasm of unspecified site of unspecified female breast: Secondary | ICD-10-CM | POA: Diagnosis not present

## 2024-02-03 DIAGNOSIS — G8194 Hemiplegia, unspecified affecting left nondominant side: Secondary | ICD-10-CM | POA: Diagnosis not present

## 2024-02-03 DIAGNOSIS — R532 Functional quadriplegia: Secondary | ICD-10-CM | POA: Diagnosis not present

## 2024-02-03 DIAGNOSIS — I82409 Acute embolism and thrombosis of unspecified deep veins of unspecified lower extremity: Secondary | ICD-10-CM | POA: Diagnosis not present

## 2024-02-03 DIAGNOSIS — I10 Essential (primary) hypertension: Secondary | ICD-10-CM | POA: Diagnosis not present

## 2024-03-03 DIAGNOSIS — I82409 Acute embolism and thrombosis of unspecified deep veins of unspecified lower extremity: Secondary | ICD-10-CM | POA: Diagnosis not present

## 2024-03-03 DIAGNOSIS — Z299 Encounter for prophylactic measures, unspecified: Secondary | ICD-10-CM | POA: Diagnosis not present

## 2024-03-03 DIAGNOSIS — I10 Essential (primary) hypertension: Secondary | ICD-10-CM | POA: Diagnosis not present

## 2024-03-03 DIAGNOSIS — F17210 Nicotine dependence, cigarettes, uncomplicated: Secondary | ICD-10-CM | POA: Diagnosis not present

## 2024-03-23 DIAGNOSIS — Z1339 Encounter for screening examination for other mental health and behavioral disorders: Secondary | ICD-10-CM | POA: Diagnosis not present

## 2024-03-23 DIAGNOSIS — I739 Peripheral vascular disease, unspecified: Secondary | ICD-10-CM | POA: Diagnosis not present

## 2024-03-23 DIAGNOSIS — Z299 Encounter for prophylactic measures, unspecified: Secondary | ICD-10-CM | POA: Diagnosis not present

## 2024-03-23 DIAGNOSIS — Z7189 Other specified counseling: Secondary | ICD-10-CM | POA: Diagnosis not present

## 2024-03-23 DIAGNOSIS — I10 Essential (primary) hypertension: Secondary | ICD-10-CM | POA: Diagnosis not present

## 2024-03-23 DIAGNOSIS — F17210 Nicotine dependence, cigarettes, uncomplicated: Secondary | ICD-10-CM | POA: Diagnosis not present

## 2024-03-23 DIAGNOSIS — Z0000 Encounter for general adult medical examination without abnormal findings: Secondary | ICD-10-CM | POA: Diagnosis not present

## 2024-03-23 DIAGNOSIS — Z1331 Encounter for screening for depression: Secondary | ICD-10-CM | POA: Diagnosis not present

## 2024-03-23 DIAGNOSIS — E039 Hypothyroidism, unspecified: Secondary | ICD-10-CM | POA: Diagnosis not present

## 2024-03-31 DIAGNOSIS — F17210 Nicotine dependence, cigarettes, uncomplicated: Secondary | ICD-10-CM | POA: Diagnosis not present

## 2024-03-31 DIAGNOSIS — I82409 Acute embolism and thrombosis of unspecified deep veins of unspecified lower extremity: Secondary | ICD-10-CM | POA: Diagnosis not present

## 2024-03-31 DIAGNOSIS — Z299 Encounter for prophylactic measures, unspecified: Secondary | ICD-10-CM | POA: Diagnosis not present

## 2024-03-31 DIAGNOSIS — I10 Essential (primary) hypertension: Secondary | ICD-10-CM | POA: Diagnosis not present

## 2024-04-08 DIAGNOSIS — I10 Essential (primary) hypertension: Secondary | ICD-10-CM | POA: Diagnosis not present

## 2024-04-08 DIAGNOSIS — I82409 Acute embolism and thrombosis of unspecified deep veins of unspecified lower extremity: Secondary | ICD-10-CM | POA: Diagnosis not present

## 2024-04-08 DIAGNOSIS — Z299 Encounter for prophylactic measures, unspecified: Secondary | ICD-10-CM | POA: Diagnosis not present

## 2024-04-08 DIAGNOSIS — F17210 Nicotine dependence, cigarettes, uncomplicated: Secondary | ICD-10-CM | POA: Diagnosis not present

## 2024-05-13 DIAGNOSIS — I10 Essential (primary) hypertension: Secondary | ICD-10-CM | POA: Diagnosis not present

## 2024-05-13 DIAGNOSIS — F17210 Nicotine dependence, cigarettes, uncomplicated: Secondary | ICD-10-CM | POA: Diagnosis not present

## 2024-05-13 DIAGNOSIS — Z299 Encounter for prophylactic measures, unspecified: Secondary | ICD-10-CM | POA: Diagnosis not present

## 2024-05-13 DIAGNOSIS — I82409 Acute embolism and thrombosis of unspecified deep veins of unspecified lower extremity: Secondary | ICD-10-CM | POA: Diagnosis not present

## 2024-08-05 DIAGNOSIS — E039 Hypothyroidism, unspecified: Secondary | ICD-10-CM | POA: Diagnosis not present

## 2024-08-05 DIAGNOSIS — F17210 Nicotine dependence, cigarettes, uncomplicated: Secondary | ICD-10-CM | POA: Diagnosis not present

## 2024-08-05 DIAGNOSIS — Z0000 Encounter for general adult medical examination without abnormal findings: Secondary | ICD-10-CM | POA: Diagnosis not present

## 2024-08-05 DIAGNOSIS — Z79899 Other long term (current) drug therapy: Secondary | ICD-10-CM | POA: Diagnosis not present

## 2024-08-05 DIAGNOSIS — Z299 Encounter for prophylactic measures, unspecified: Secondary | ICD-10-CM | POA: Diagnosis not present

## 2024-08-05 DIAGNOSIS — R5383 Other fatigue: Secondary | ICD-10-CM | POA: Diagnosis not present

## 2024-08-05 DIAGNOSIS — I10 Essential (primary) hypertension: Secondary | ICD-10-CM | POA: Diagnosis not present

## 2024-08-05 DIAGNOSIS — E7800 Pure hypercholesterolemia, unspecified: Secondary | ICD-10-CM | POA: Diagnosis not present

## 2024-08-11 DIAGNOSIS — E871 Hypo-osmolality and hyponatremia: Secondary | ICD-10-CM | POA: Diagnosis not present

## 2024-09-24 DIAGNOSIS — R52 Pain, unspecified: Secondary | ICD-10-CM | POA: Diagnosis not present

## 2024-09-24 DIAGNOSIS — M545 Low back pain, unspecified: Secondary | ICD-10-CM | POA: Diagnosis not present

## 2024-09-24 DIAGNOSIS — R532 Functional quadriplegia: Secondary | ICD-10-CM | POA: Diagnosis not present

## 2024-09-24 DIAGNOSIS — I82409 Acute embolism and thrombosis of unspecified deep veins of unspecified lower extremity: Secondary | ICD-10-CM | POA: Diagnosis not present

## 2024-09-24 DIAGNOSIS — G8194 Hemiplegia, unspecified affecting left nondominant side: Secondary | ICD-10-CM | POA: Diagnosis not present

## 2024-09-27 DIAGNOSIS — F17210 Nicotine dependence, cigarettes, uncomplicated: Secondary | ICD-10-CM | POA: Diagnosis not present

## 2024-09-27 DIAGNOSIS — S32010A Wedge compression fracture of first lumbar vertebra, initial encounter for closed fracture: Secondary | ICD-10-CM | POA: Diagnosis not present

## 2024-09-27 DIAGNOSIS — R911 Solitary pulmonary nodule: Secondary | ICD-10-CM | POA: Diagnosis not present

## 2024-09-27 DIAGNOSIS — M4856XA Collapsed vertebra, not elsewhere classified, lumbar region, initial encounter for fracture: Secondary | ICD-10-CM | POA: Diagnosis not present

## 2024-09-27 DIAGNOSIS — M549 Dorsalgia, unspecified: Secondary | ICD-10-CM | POA: Diagnosis not present

## 2024-09-27 DIAGNOSIS — Z7902 Long term (current) use of antithrombotics/antiplatelets: Secondary | ICD-10-CM | POA: Diagnosis not present

## 2024-09-27 DIAGNOSIS — G9389 Other specified disorders of brain: Secondary | ICD-10-CM | POA: Diagnosis not present

## 2024-09-27 DIAGNOSIS — W1839XA Other fall on same level, initial encounter: Secondary | ICD-10-CM | POA: Diagnosis not present

## 2024-09-27 DIAGNOSIS — Z743 Need for continuous supervision: Secondary | ICD-10-CM | POA: Diagnosis not present

## 2024-09-27 DIAGNOSIS — J439 Emphysema, unspecified: Secondary | ICD-10-CM | POA: Diagnosis not present

## 2024-09-27 DIAGNOSIS — I2510 Atherosclerotic heart disease of native coronary artery without angina pectoris: Secondary | ICD-10-CM | POA: Diagnosis not present

## 2024-09-27 DIAGNOSIS — M47816 Spondylosis without myelopathy or radiculopathy, lumbar region: Secondary | ICD-10-CM | POA: Diagnosis not present

## 2024-09-27 DIAGNOSIS — J9811 Atelectasis: Secondary | ICD-10-CM | POA: Diagnosis not present

## 2024-09-27 DIAGNOSIS — M51369 Other intervertebral disc degeneration, lumbar region without mention of lumbar back pain or lower extremity pain: Secondary | ICD-10-CM | POA: Diagnosis not present

## 2024-09-27 DIAGNOSIS — M5136 Other intervertebral disc degeneration, lumbar region with discogenic back pain only: Secondary | ICD-10-CM | POA: Diagnosis not present

## 2024-10-01 DIAGNOSIS — S32010A Wedge compression fracture of first lumbar vertebra, initial encounter for closed fracture: Secondary | ICD-10-CM | POA: Diagnosis not present

## 2024-10-11 DIAGNOSIS — R532 Functional quadriplegia: Secondary | ICD-10-CM | POA: Diagnosis not present

## 2024-10-11 DIAGNOSIS — G8194 Hemiplegia, unspecified affecting left nondominant side: Secondary | ICD-10-CM | POA: Diagnosis not present

## 2024-10-11 DIAGNOSIS — J439 Emphysema, unspecified: Secondary | ICD-10-CM | POA: Diagnosis not present

## 2024-10-11 DIAGNOSIS — R52 Pain, unspecified: Secondary | ICD-10-CM | POA: Diagnosis not present

## 2024-10-11 DIAGNOSIS — I82409 Acute embolism and thrombosis of unspecified deep veins of unspecified lower extremity: Secondary | ICD-10-CM | POA: Diagnosis not present

## 2024-10-11 DIAGNOSIS — I10 Essential (primary) hypertension: Secondary | ICD-10-CM | POA: Diagnosis not present

## 2024-10-11 DIAGNOSIS — Z299 Encounter for prophylactic measures, unspecified: Secondary | ICD-10-CM | POA: Diagnosis not present

## 2024-10-20 DIAGNOSIS — Z7902 Long term (current) use of antithrombotics/antiplatelets: Secondary | ICD-10-CM | POA: Diagnosis not present

## 2024-10-20 DIAGNOSIS — C50919 Malignant neoplasm of unspecified site of unspecified female breast: Secondary | ICD-10-CM | POA: Diagnosis not present

## 2024-10-20 DIAGNOSIS — E039 Hypothyroidism, unspecified: Secondary | ICD-10-CM | POA: Diagnosis not present

## 2024-10-20 DIAGNOSIS — M810 Age-related osteoporosis without current pathological fracture: Secondary | ICD-10-CM | POA: Diagnosis not present

## 2024-10-20 DIAGNOSIS — Z79890 Hormone replacement therapy (postmenopausal): Secondary | ICD-10-CM | POA: Diagnosis not present

## 2024-10-20 DIAGNOSIS — Z9989 Dependence on other enabling machines and devices: Secondary | ICD-10-CM | POA: Diagnosis not present

## 2024-10-20 DIAGNOSIS — M519 Unspecified thoracic, thoracolumbar and lumbosacral intervertebral disc disorder: Secondary | ICD-10-CM | POA: Diagnosis not present

## 2024-10-20 DIAGNOSIS — I10 Essential (primary) hypertension: Secondary | ICD-10-CM | POA: Diagnosis not present

## 2024-10-20 DIAGNOSIS — Z993 Dependence on wheelchair: Secondary | ICD-10-CM | POA: Diagnosis not present

## 2024-10-20 DIAGNOSIS — F17210 Nicotine dependence, cigarettes, uncomplicated: Secondary | ICD-10-CM | POA: Diagnosis not present

## 2024-10-20 DIAGNOSIS — I69354 Hemiplegia and hemiparesis following cerebral infarction affecting left non-dominant side: Secondary | ICD-10-CM | POA: Diagnosis not present

## 2024-10-20 DIAGNOSIS — Z7983 Long term (current) use of bisphosphonates: Secondary | ICD-10-CM | POA: Diagnosis not present

## 2024-10-20 DIAGNOSIS — R32 Unspecified urinary incontinence: Secondary | ICD-10-CM | POA: Diagnosis not present

## 2024-10-20 DIAGNOSIS — M1990 Unspecified osteoarthritis, unspecified site: Secondary | ICD-10-CM | POA: Diagnosis not present

## 2024-10-20 DIAGNOSIS — E785 Hyperlipidemia, unspecified: Secondary | ICD-10-CM | POA: Diagnosis not present

## 2024-10-20 DIAGNOSIS — Z833 Family history of diabetes mellitus: Secondary | ICD-10-CM | POA: Diagnosis not present

## 2024-10-20 DIAGNOSIS — I2510 Atherosclerotic heart disease of native coronary artery without angina pectoris: Secondary | ICD-10-CM | POA: Diagnosis not present

## 2024-10-20 DIAGNOSIS — E049 Nontoxic goiter, unspecified: Secondary | ICD-10-CM | POA: Diagnosis not present
